# Patient Record
Sex: Female | Born: 1935 | Race: White | Hispanic: No | Smoking: Former smoker
Health system: Southern US, Community
[De-identification: ages and names within clinical notes are randomized; demographics above are authoritative.]

## PROBLEM LIST (undated history)

## (undated) DIAGNOSIS — I10 Essential (primary) hypertension: Secondary | ICD-10-CM

## (undated) DIAGNOSIS — E785 Hyperlipidemia, unspecified: Secondary | ICD-10-CM

## (undated) DIAGNOSIS — F32A Depression, unspecified: Secondary | ICD-10-CM

## (undated) DIAGNOSIS — R112 Nausea with vomiting, unspecified: Secondary | ICD-10-CM

## (undated) DIAGNOSIS — E119 Type 2 diabetes mellitus without complications: Secondary | ICD-10-CM

## (undated) DIAGNOSIS — I517 Cardiomegaly: Secondary | ICD-10-CM

## (undated) DIAGNOSIS — F329 Major depressive disorder, single episode, unspecified: Secondary | ICD-10-CM

## (undated) DIAGNOSIS — I4891 Unspecified atrial fibrillation: Secondary | ICD-10-CM

## (undated) DIAGNOSIS — I509 Heart failure, unspecified: Secondary | ICD-10-CM

## (undated) DIAGNOSIS — Z9889 Other specified postprocedural states: Secondary | ICD-10-CM

## (undated) HISTORY — DX: Hyperlipidemia, unspecified: E78.5

## (undated) HISTORY — DX: Depression, unspecified: F32.A

## (undated) HISTORY — PX: OTHER SURGICAL HISTORY: SHX169

## (undated) HISTORY — DX: Morbid (severe) obesity due to excess calories: E66.01

## (undated) HISTORY — DX: Major depressive disorder, single episode, unspecified: F32.9

## (undated) HISTORY — DX: Cardiomegaly: I51.7

## (undated) HISTORY — DX: Heart failure, unspecified: I50.9

## (undated) HISTORY — DX: Essential (primary) hypertension: I10

---

## 1962-10-15 HISTORY — PX: BREAST SURGERY: SHX581

## 1967-10-16 HISTORY — PX: ABDOMINAL HYSTERECTOMY: SHX81

## 2004-10-15 HISTORY — PX: CATARACT EXTRACTION: SUR2

## 2006-10-15 LAB — HM DIABETES EYE EXAM

## 2007-12-25 ENCOUNTER — Encounter: Payer: Self-pay | Admitting: Internal Medicine

## 2008-06-10 ENCOUNTER — Ambulatory Visit: Payer: Self-pay | Admitting: Internal Medicine

## 2008-06-10 DIAGNOSIS — R0989 Other specified symptoms and signs involving the circulatory and respiratory systems: Secondary | ICD-10-CM

## 2008-06-10 DIAGNOSIS — R0609 Other forms of dyspnea: Secondary | ICD-10-CM

## 2008-06-14 LAB — CONVERTED CEMR LAB
ALT: 13 units/L (ref 0–35)
AST: 16 units/L (ref 0–37)
Albumin: 3.8 g/dL (ref 3.5–5.2)
Alkaline Phosphatase: 73 units/L (ref 39–117)
BUN: 10 mg/dL (ref 6–23)
Basophils Absolute: 0.1 10*3/uL (ref 0.0–0.1)
Basophils Relative: 1 % (ref 0.0–3.0)
Bilirubin, Direct: 0.2 mg/dL (ref 0.0–0.3)
CO2: 32 meq/L (ref 19–32)
Calcium: 8.9 mg/dL (ref 8.4–10.5)
Chloride: 105 meq/L (ref 96–112)
Creatinine, Ser: 1.1 mg/dL (ref 0.4–1.2)
Eosinophils Absolute: 0.3 10*3/uL (ref 0.0–0.7)
Eosinophils Relative: 2.5 % (ref 0.0–5.0)
GFR calc Af Amer: 63 mL/min
GFR calc non Af Amer: 52 mL/min
Glucose, Bld: 70 mg/dL (ref 70–99)
HCT: 40.2 % (ref 36.0–46.0)
Hemoglobin: 14.2 g/dL (ref 12.0–15.0)
Lymphocytes Relative: 17.7 % (ref 12.0–46.0)
MCHC: 35.3 g/dL (ref 30.0–36.0)
MCV: 87.7 fL (ref 78.0–100.0)
Monocytes Absolute: 0.8 10*3/uL (ref 0.1–1.0)
Monocytes Relative: 6.6 % (ref 3.0–12.0)
Neutro Abs: 8.8 10*3/uL — ABNORMAL HIGH (ref 1.4–7.7)
Neutrophils Relative %: 72.2 % (ref 43.0–77.0)
Platelets: 274 10*3/uL (ref 150–400)
Potassium: 4.4 meq/L (ref 3.5–5.1)
Pro B Natriuretic peptide (BNP): 95 pg/mL (ref 0.0–100.0)
RBC: 4.58 M/uL (ref 3.87–5.11)
RDW: 12.5 % (ref 11.5–14.6)
Sodium: 141 meq/L (ref 135–145)
TSH: 1.85 microintl units/mL (ref 0.35–5.50)
Total Bilirubin: 1.1 mg/dL (ref 0.3–1.2)
Total Protein: 6.8 g/dL (ref 6.0–8.3)
WBC: 12.1 10*3/uL — ABNORMAL HIGH (ref 4.5–10.5)

## 2008-07-14 ENCOUNTER — Ambulatory Visit: Payer: Self-pay | Admitting: Internal Medicine

## 2008-07-16 ENCOUNTER — Ambulatory Visit: Payer: Self-pay | Admitting: Cardiovascular Disease

## 2008-07-29 ENCOUNTER — Encounter: Payer: Self-pay | Admitting: Internal Medicine

## 2008-08-25 ENCOUNTER — Encounter: Payer: Self-pay | Admitting: Internal Medicine

## 2008-09-06 ENCOUNTER — Telehealth (INDEPENDENT_AMBULATORY_CARE_PROVIDER_SITE_OTHER): Payer: Self-pay | Admitting: *Deleted

## 2008-09-07 ENCOUNTER — Telehealth (INDEPENDENT_AMBULATORY_CARE_PROVIDER_SITE_OTHER): Payer: Self-pay | Admitting: *Deleted

## 2008-09-11 ENCOUNTER — Encounter: Payer: Self-pay | Admitting: Internal Medicine

## 2008-10-15 LAB — HM MAMMOGRAPHY

## 2008-10-19 ENCOUNTER — Ambulatory Visit: Payer: Self-pay | Admitting: Internal Medicine

## 2008-10-19 DIAGNOSIS — R635 Abnormal weight gain: Secondary | ICD-10-CM | POA: Insufficient documentation

## 2008-10-19 DIAGNOSIS — I2789 Other specified pulmonary heart diseases: Secondary | ICD-10-CM

## 2008-10-26 ENCOUNTER — Encounter: Payer: Self-pay | Admitting: Internal Medicine

## 2009-01-03 ENCOUNTER — Telehealth (INDEPENDENT_AMBULATORY_CARE_PROVIDER_SITE_OTHER): Payer: Self-pay | Admitting: *Deleted

## 2009-09-20 ENCOUNTER — Telehealth (INDEPENDENT_AMBULATORY_CARE_PROVIDER_SITE_OTHER): Payer: Self-pay | Admitting: *Deleted

## 2009-12-20 ENCOUNTER — Ambulatory Visit: Payer: Self-pay | Admitting: Cardiovascular Disease

## 2009-12-26 ENCOUNTER — Telehealth: Payer: Self-pay | Admitting: Cardiovascular Disease

## 2010-08-25 ENCOUNTER — Encounter: Payer: Self-pay | Admitting: Internal Medicine

## 2010-09-06 ENCOUNTER — Telehealth: Payer: Self-pay | Admitting: Internal Medicine

## 2010-09-19 ENCOUNTER — Encounter: Payer: Self-pay | Admitting: Internal Medicine

## 2010-10-18 ENCOUNTER — Encounter: Payer: Self-pay | Admitting: Internal Medicine

## 2010-10-20 ENCOUNTER — Ambulatory Visit
Admission: RE | Admit: 2010-10-20 | Payer: Self-pay | Source: Home / Self Care | Attending: Internal Medicine | Admitting: Internal Medicine

## 2010-10-30 LAB — POCT I-STAT 3, VENOUS BLOOD GAS (G3P V)
Acid-Base Excess: 2 mmol/L (ref 0.0–2.0)
Acid-Base Excess: 3 mmol/L — ABNORMAL HIGH (ref 0.0–2.0)
Acid-Base Excess: 3 mmol/L — ABNORMAL HIGH (ref 0.0–2.0)
Bicarbonate: 26.9 mEq/L — ABNORMAL HIGH (ref 20.0–24.0)
Bicarbonate: 27.5 mEq/L — ABNORMAL HIGH (ref 20.0–24.0)
Bicarbonate: 27.9 mEq/L — ABNORMAL HIGH (ref 20.0–24.0)
O2 Saturation: 57 %
O2 Saturation: 58 %
O2 Saturation: 60 %
TCO2: 28 mmol/L (ref 0–100)
TCO2: 29 mmol/L (ref 0–100)
TCO2: 29 mmol/L (ref 0–100)
pCO2, Ven: 43.5 mmHg — ABNORMAL LOW (ref 45.0–50.0)
pCO2, Ven: 44.3 mmHg — ABNORMAL LOW (ref 45.0–50.0)
pCO2, Ven: 44.4 mmHg — ABNORMAL LOW (ref 45.0–50.0)
pH, Ven: 7.391 — ABNORMAL HIGH (ref 7.250–7.300)
pH, Ven: 7.406 — ABNORMAL HIGH (ref 7.250–7.300)
pH, Ven: 7.41 — ABNORMAL HIGH (ref 7.250–7.300)
pO2, Ven: 30 mmHg (ref 30.0–45.0)
pO2, Ven: 30 mmHg (ref 30.0–45.0)
pO2, Ven: 31 mmHg (ref 30.0–45.0)

## 2010-10-30 LAB — POCT I-STAT 3, ART BLOOD GAS (G3+)
Acid-base deficit: 2 mmol/L (ref 0.0–2.0)
Bicarbonate: 23.4 mEq/L (ref 20.0–24.0)
O2 Saturation: 85 %
TCO2: 25 mmol/L (ref 0–100)
pCO2 arterial: 42.5 mmHg (ref 35.0–45.0)
pH, Arterial: 7.348 — ABNORMAL LOW (ref 7.350–7.400)
pO2, Arterial: 53 mmHg — ABNORMAL LOW (ref 80.0–100.0)

## 2010-10-30 LAB — GLUCOSE, POCT (MANUAL RESULT ENTRY)
Glucose, Bld: 122 mg/dL — ABNORMAL HIGH (ref 70–99)
Operator id: 194801

## 2010-11-14 NOTE — Progress Notes (Signed)
Summary: sch cath  Phone Note Outgoing Call   Call placed by: Meredith Staggers, RN,  September 06, 2010 5:21 PM Summary of Call: received paperwork from Dr Blenda Nicely requesting pt have a right heart cath w/Dr Benismhon fro pulm htn, have called and left message for pt to call back to sch, available days include thur 12/8, Fri 12/9 at 1:30, or Fri 12/16 will wait to hear back from pt  Follow-up for Phone Call        Left message to call back Meredith Staggers, RN  September 19, 2010 11:54 AM   spoke w/pt she only can have done on a Fri offered appt this Fri 12/9 she states she has an appt w/her daughter, offered 12/23 but she states too close to Christmas, cath sch for 1/6 at 1:30 instructions reviewed w/pt over phone and copy mailed to her Meredith Staggers, RN  September 19, 2010 2:19 PM

## 2010-11-14 NOTE — Progress Notes (Signed)
  Phone Note Outgoing Call   Call placed by: Dessie Coma LPN Call placed to: Patient Summary of Call: Patient notified per Dr. Freida Busman, Echo showed normal heart function and labs were normal.  Per patient, her Zenaida Niece has broken down and doesn't know when they will get it fixed so she cannot pick up monitor in [place] on Friday as planned.  She is going to call [place] office to let them know.

## 2010-11-14 NOTE — Letter (Signed)
Summary: Cardiac Catheterization Instructions- [facility]  [facility], Main Office  [address]   [place], [place] [address]   Phone: [phone]  Fax: [phone]     09/19/2010 MRN: [id number]  [facility] [address] [place], [place]  [address]  Dear Ms. SHERRARD,   You are scheduled for a Cardiac Catheterization on Friday Jan. 6, 2012 with Dr. Gala Romney.  Please arrive to the 1st floor of the Heart and Vascular Center at [facility] at 12:30 pm on the day of your procedure. Please do not arrive before 6:30 a.m. Call the Heart and Vascular Center at [phone] if you are unable to make your appointmnet. The Code to get into the parking garage under the building is 0030. Take the elevators to the 1st floor. You must have someone to drive you home. Someone must be with you for the first 24 hours after you arrive home. Please wear clothes that are easy to get on and off and wear slip-on shoes. Do not eat or drink after midnight except water with your medications that morning. Bring all your medications and current insurance cards with you.  _X__ DO NOT take these medications before your procedure: _______Furosemide_________________________________________________________  ___ Make sure you take your aspirin.  ___ You may take ALL of your medications with water that morning. ________________________________________________________________________________________________________________________________  ___ DO NOT take ANY medications before your procedure.  ___ Pre-med instructions:  ________________________________________________________________________________________________________________________________  The usual length of stay after your procedure is 2 to 3 hours. This can vary.  If you have any questions, please call the office at the number listed above.   Meredith Staggers, RN

## 2010-11-16 NOTE — Cardiovascular Report (Signed)
Summary: Pre Cath Orders   Pre Cath Orders   Imported By: Roderic Ovens 09/29/2010 14:32:06  _____________________________________________________________________  External Attachment:    Type:   Image     Comment:   External Document

## 2010-11-16 NOTE — Letter (Signed)
Summary: [facility] Office Visit Note   [facility] Office Visit Note   Imported By: Roderic Ovens 09/26/2010 15:03:23  _____________________________________________________________________  External Attachment:    Type:   Image     Comment:   External Document

## 2011-02-27 NOTE — Letter (Signed)
December 20, 2009    Gwendlyn Deutscher, M.D.  [address]  [place], [place] [address]   RE:  Sarah, CARTELLI  MRN:  [id number]  /  DOB:  15-Nov-1932   Dear Dr. Jeanie Sewer:   I had the pleasure of seeing your patient, [name], in clinic  this morning.  As you know, she is a 77 year old female with chronic  atrial fibrillation who presents with dyspnea on exertion.  It appears  she has symptoms consistent with NYHA class II heart failure.  She does  not have a history of coronary disease.  I am proceeding with a  transthoracic echocardiogram to evaluate her left ventricular systolic  function.  I am also placing her on a 24-hour cardiac event monitor to  gauge her heart rate control, and will check a digoxin level, as this  medication may be contributing to some of the fatigue she is  experiencing.   Thank you for the referral of this patient, and I look forward to  following her along with you.  Please contact my office if I can be of  any further assistance.    Sincerely,      Brayton El, MD  Electronically Signed    SGA/MedQ  DD: 12/20/2009  DT: 12/20/2009  Job #: [id number]

## 2011-02-27 NOTE — Assessment & Plan Note (Signed)
[facility]                        [facility] OFF[facility] NOTE   JURNEI, LATINI                      MRN:          [id number]  DATE:12/20/2009                            DOB:          Feb 02, 1933    CHIEF COMPLAINT:  Shortness of breath.   HISTORY OF PRESENT ILLNESS:  Ms. Brazie is a 77 year old white female  with past medical history significant for chronic atrial fibrillation,  congestive heart failure, diabetes, hypertension, who is presenting for  cardiovascular evaluation.  The patient states that she has had atrial  fibrillation for quite a long time.  She was on Coumadin, but requested  discontinuation of this medication secondary to side effects.  She did  not have an overt GI bleed.  The patient also states she has had 2 left  heart catheterizations last of which was 2 years ago that was completely  within normal limits.  She is unsure as to any conclusions made about  her heart failure in the past.  She does endorse chronic dyspnea on  exertion.  She states that she gets short of breath doing even simple  daily activities.  This has been chronic for over a year.  She believes  that this may be related to anxiety as she does have a long time history  of anxiety and depression.  This is confirmed by her daughter who is  with her today in clinic.  She has had no recent changes to her  medications and is usually compliant with the medications.  However, she  does have some financial constraints and is sometimes unable to fill her  prescriptions.  She denies any chest discomfort, syncopal episodes, or  lower extremity edema.   PAST MEDICAL HISTORY:  As above in HPI.   SOCIAL HISTORY:  She quit smoking 30 years ago, does not drink alcohol.   FAMILY HISTORY:  Negative for premature coronary artery disease.   ALLERGIES:  No known drug allergies.   MEDICATIONS:  1. Aspirin 81 mg daily.  2. Digoxin 0.125 mg 1-1/2 tablets daily.  3. Atenolol 25 mg daily.  4. Furosemide 80 mg daily.  5. Diovan 320 mg daily.  6. Cymbalta 60 mg daily.  7. Potassium supplementation daily.  8. Bactrim DS.  9. Alprazolam.  10.Humulin insulin.   REVIEW OF SYSTEMS:  As in HPI addition, the patient endorses symptoms of  anxiety and depression.  She also states she was able to lose about 70  pounds over the past 2 years.  Other systems as in HPI are otherwise  negative.   PHYSICAL EXAMINATION:  VITAL SIGNS:  Blood pressure is 156/78, pulse is  64, sating 96% on room air.  She weighs 223 pounds.  GENERAL:  No acute distress.  HEENT:  Normocephalic, atraumatic.  NECK:  Supple.  There is no JVD in the seated position.  HEART:  Irregularly irregular distant heart sounds.  LUNGS:  Mild crackles at the left base.  ABDOMEN:  Soft, nontender.  EXTREMITIES:  Without edema.  SKIN:  Warm and dry.  PSYCHIATRIC:  The patient is appropriate.  NEURO:  Appears nonfocal.  MUSCULOSKELETAL:  5/5 bilateral upper and lower extremity strength.   EKG taken today in clinic independently reviewed by myself demonstrates  atrial fibrillation with a ventricular response rate of 59 beats per  minute.  Review of labs dated October 2010, CMP within normal limits  including a potassium 4.7 and a creatinine of 1, CO2 was mildly elevated  at 31.  CBC had a white count of 13.3, hemoglobin 14, platelet count  311.  Records of the patient's heart catheterization, prior echoes,  prior cardiac monitors, and cardiac office visits are not currently  available.   ASSESSMENT:  A 77 year old female that has been experiencing symptoms  consistent with NYHA class II congestive heart failure.  We currently do  not know the patient's systolic function, but she does not have a  history of coronary artery disease nor she having any symptoms  consistent with angina.   PLAN:  We will obtain records including prior echocardiogram, stress  tests, and cardiology office visits.   We will check a digoxin level as  well as place the patient on a 24-hour cardiac event monitor to assess  her heart rate with the atrial fibrillation.  We will order a  transthoracic echocardiogram to check her left ventricular systolic  function as she states she has not had an echo in probably about 2  years' time.  At last, we will check a BNP.  We will contact the patient  once the results of these studies are obtained.     Brayton El, MD  Electronically Signed    SGA/MedQ  DD: 12/20/2009  DT: 12/21/2009  Job #: [id number]

## 2011-05-14 ENCOUNTER — Encounter: Payer: Self-pay | Admitting: Cardiovascular Disease

## 2011-06-22 ENCOUNTER — Encounter: Payer: Self-pay | Admitting: Cardiovascular Disease

## 2011-07-12 ENCOUNTER — Telehealth: Payer: Self-pay | Admitting: Internal Medicine

## 2011-07-12 NOTE — Telephone Encounter (Signed)
All Cardiac faxed to [facility] @ [phone]  07/12/11/km

## 2011-10-16 LAB — HM PAP SMEAR

## 2012-07-31 ENCOUNTER — Other Ambulatory Visit: Payer: Self-pay | Admitting: Obstetrics & Gynecology

## 2012-07-31 DIAGNOSIS — R198 Other specified symptoms and signs involving the digestive system and abdomen: Secondary | ICD-10-CM

## 2012-08-05 ENCOUNTER — Ambulatory Visit (HOSPITAL_COMMUNITY)
Admission: RE | Admit: 2012-08-05 | Discharge: 2012-08-05 | Disposition: A | Discharge status: Home / Self Care | Payer: Medicare Other | Source: Ambulatory Visit | Attending: Obstetrics & Gynecology | Admitting: Obstetrics & Gynecology

## 2012-08-05 DIAGNOSIS — R198 Other specified symptoms and signs involving the digestive system and abdomen: Secondary | ICD-10-CM

## 2012-08-05 DIAGNOSIS — R635 Abnormal weight gain: Secondary | ICD-10-CM | POA: Insufficient documentation

## 2012-08-05 DIAGNOSIS — J984 Other disorders of lung: Secondary | ICD-10-CM | POA: Insufficient documentation

## 2012-08-05 DIAGNOSIS — K449 Diaphragmatic hernia without obstruction or gangrene: Secondary | ICD-10-CM | POA: Insufficient documentation

## 2012-08-05 DIAGNOSIS — I517 Cardiomegaly: Secondary | ICD-10-CM | POA: Insufficient documentation

## 2012-08-05 MED ORDER — IOHEXOL 300 MG/ML  SOLN
100.0000 mL | Freq: Once | INTRAMUSCULAR | Status: AC | PRN
  Administered 2012-08-05: 100 mL via INTRAVENOUS

## 2012-12-10 ENCOUNTER — Ambulatory Visit (INDEPENDENT_AMBULATORY_CARE_PROVIDER_SITE_OTHER)
Admission: RE | Admit: 2012-12-10 | Discharge: 2012-12-10 | Disposition: A | Discharge status: Home / Self Care | Payer: Medicare Other | Source: Ambulatory Visit | Attending: Internal Medicine | Admitting: Internal Medicine

## 2012-12-10 ENCOUNTER — Encounter: Payer: Self-pay | Admitting: Internal Medicine

## 2012-12-10 ENCOUNTER — Ambulatory Visit (INDEPENDENT_AMBULATORY_CARE_PROVIDER_SITE_OTHER): Payer: Medicare Other | Admitting: Internal Medicine

## 2012-12-10 VITALS — BP 180/100 | HR 88 | Temp 97.2°F | Ht 67.0 in | Wt 249.8 lb

## 2012-12-10 DIAGNOSIS — R0609 Other forms of dyspnea: Secondary | ICD-10-CM

## 2012-12-10 DIAGNOSIS — I10 Essential (primary) hypertension: Secondary | ICD-10-CM

## 2012-12-10 DIAGNOSIS — J9610 Chronic respiratory failure, unspecified whether with hypoxia or hypercapnia: Secondary | ICD-10-CM | POA: Insufficient documentation

## 2012-12-10 DIAGNOSIS — R0600 Dyspnea, unspecified: Secondary | ICD-10-CM

## 2012-12-10 MED ORDER — NEBIVOLOL HCL 10 MG PO TABS: 10.0000 mg | ORAL_TABLET | Freq: Every day | ORAL | Status: DC

## 2012-12-10 NOTE — Patient Instructions (Addendum)
bystolic 10 mg twice daily in place of atenolol  Only use nebulizer if you feel it really helps you  Wear 02 2lpm 24 hours per day for  Now  Please remember to go to the   x-ray department downstairs for your tests - we will call you with the results when they are available.  Please schedule a follow up office visit in 4 weeks, sooner if needed with pfts' on return

## 2012-12-10 NOTE — Progress Notes (Addendum)
Subjective:    Patient ID: Sarah Kirk, female    DOB: [birth year] MRN: [id number]  HPI  59 yowf quit smoking 1981 at wt < 170-200  eval in 2010 in pulmonary clinic with mostly restrictive changes c/w obesity.   Previous w/u: baseline = last Nov 08 dx with chf transiently better and fine able to walk outside up incline until Feb 09 gradual worsening to point of sob rm to rm no noct awakening just sob if she has to get out of bed to go to the bathroom. neb doesn't help much.  06/10/08 initial eval, copd vs ace, ace d/c'd   July 14, 2008 ov 100% better to her satisfaction after stopped ace, returns for PFT's, no limiiting sob, cough, cp. only finding was disproportionate reduction in diffusing capacity.   October 19, 2008 ov  Because of obesity I worried about occult thromboembolic disease and obtained a CT scan of her chest which was normal except for elevated right heart structures suggesting chronic pulmonary hypertension. This lead to overnight sleep oximetry showing 25 minutes of desaturation for which I recommended she be placed on oxygen at 2 L but she says she feels better when she sleeps without it.   rec noct 02 titrated to adequate sats Serial echo to follow ? PAH > to see [facility] .     12/10/2012 1st pulmonary ov in EPIC era with wt drop from 272  Down to 190  2013  With no problem  Breathing and no need for 02 then started downhill since Feb 2013 didn't directly relate to wt much worse since Aug 2013 when fell broke 2 ribs >  now sob room to room, placed on 02 prn since a year ago. No better on neb.  No obvious daytime variabilty or assoc chronic cough or cp or chest tightness, subjective wheeze overt sinus or hb symptoms. No unusual exp hx or h/o childhood pna/ asthma or premature birth to her knowledge.   Sleeping ok without nocturnal  or early am exacerbation  of respiratory  c/o's or need for noct saba. 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 Negative for fever and chills.  HENT: Positive for sneezing and dental problem. Negative for ear pain, nosebleeds, congestion, sore throat, rhinorrhea, trouble swallowing, voice change, postnasal drip and sinus pressure.   Eyes: Negative for visual disturbance.  Respiratory: Positive for cough and shortness of breath. Negative for choking.   Cardiovascular: Positive for leg swelling. Negative for chest pain.  Gastrointestinal: Negative for vomiting, abdominal pain and diarrhea.  Genitourinary: Negative for difficulty urinating.  Musculoskeletal: Positive for arthralgias.  Skin: Negative for rash.  Neurological: Positive for headaches. Negative for tremors and syncope.  Hematological: Does not bruise/bleed easily.  Objective:   Physical Exam amb obese wf nad Wt Readings from Last 3 Encounters:  12/10/12 249 lb 12.8 oz (113.309 kg)  10/19/08 234 lb (106.142 kg)  07/14/08 238 lb (107.956 kg)    amb obese wm nad with prominent pseudoasthma  HEENT: nl dentition, turbinates, and orophanx. Nl external ear canals without cough reflex   NECK :  without JVD/Nodes/TM/ nl carotid upstrokes bilaterally   LUNGS: no acc muscle use, clear to A and P bilaterally without cough on insp or exp maneuvers   CV:  RRR   no s3 or murmur or increase in P2, no edema   ABD:  soft and nontender with limited excursion with inspiration. No bruits or organomegaly, bowel sounds nl  MS:  warm without deformities, calf tenderness, cyanosis or clubbing  SKIN: warm and dry without lesions    NEURO:  alert, approp, no deficits    CXR  12/11/2012 :  Diffuse chronic pulmonary parenchymal changes without acute finding.            Assessment & Plan:

## 2012-12-12 NOTE — Progress Notes (Signed)
Quick Note:  Spoke with pt and notified of results per Dr. Wert. Pt verbalized understanding and denied any questions.  ______ 

## 2012-12-14 ENCOUNTER — Encounter: Payer: Self-pay | Admitting: Internal Medicine

## 2012-12-14 NOTE — Assessment & Plan Note (Signed)
Changed to bystolic due to ? Asthma component effective 12/11/12  Not clear whether failure to respond to saba means she doesn't have an asthmatic component or that she does but the B Blocker effect of relatively high doses of atenolol prevents it from working  Strongly prefer in this setting: Bystolic, the most beta -1  selective Beta blocker available in sample form, with bisoprolol the most selective generic choice  on the market.

## 2012-12-14 NOTE — Assessment & Plan Note (Signed)
-   12/10/2012  Walked 2lpm x 1 laps @ 185 ft each stopped due to  desat to 88%  Previous eval entirely c/w obesity but will need re-eval and more aggressive rx if developing complications like PAH

## 2013-01-21 ENCOUNTER — Ambulatory Visit: Payer: Medicare Other | Admitting: Internal Medicine

## 2013-01-28 ENCOUNTER — Ambulatory Visit (INDEPENDENT_AMBULATORY_CARE_PROVIDER_SITE_OTHER): Payer: Medicare Other

## 2013-01-28 ENCOUNTER — Ambulatory Visit (INDEPENDENT_AMBULATORY_CARE_PROVIDER_SITE_OTHER): Payer: Medicare Other | Admitting: Internal Medicine

## 2013-01-28 ENCOUNTER — Encounter: Payer: Self-pay | Admitting: Internal Medicine

## 2013-01-28 VITALS — BP 118/80 | HR 81 | Temp 97.8°F | Ht 68.0 in | Wt 253.0 lb

## 2013-01-28 DIAGNOSIS — N3090 Cystitis, unspecified without hematuria: Secondary | ICD-10-CM

## 2013-01-28 DIAGNOSIS — I10 Essential (primary) hypertension: Secondary | ICD-10-CM

## 2013-01-28 DIAGNOSIS — R0989 Other specified symptoms and signs involving the circulatory and respiratory systems: Secondary | ICD-10-CM

## 2013-01-28 DIAGNOSIS — R0609 Other forms of dyspnea: Secondary | ICD-10-CM

## 2013-01-28 DIAGNOSIS — J9610 Chronic respiratory failure, unspecified whether with hypoxia or hypercapnia: Secondary | ICD-10-CM

## 2013-01-28 LAB — CBC WITH DIFFERENTIAL/PLATELET
Basophils Absolute: 0 10*3/uL (ref 0.0–0.1)
Basophils Relative: 0.4 % (ref 0.0–3.0)
Eosinophils Absolute: 0.3 10*3/uL (ref 0.0–0.7)
Hemoglobin: 13.9 g/dL (ref 12.0–15.0)
Lymphocytes Relative: 13.1 % (ref 12.0–46.0)
MCHC: 34.5 g/dL (ref 30.0–36.0)
Monocytes Relative: 6.7 % (ref 3.0–12.0)
Neutrophils Relative %: 77.4 % — ABNORMAL HIGH (ref 43.0–77.0)
RBC: 4.6 Mil/uL (ref 3.87–5.11)
RDW: 13.6 % (ref 11.5–14.6)

## 2013-01-28 LAB — URINALYSIS, ROUTINE W REFLEX MICROSCOPIC
Bilirubin Urine: NEGATIVE
Total Protein, Urine: 30
Urine Glucose: 500
Urobilinogen, UA: 0.2 (ref 0.0–1.0)

## 2013-01-28 LAB — BRAIN NATRIURETIC PEPTIDE: Pro B Natriuretic peptide (BNP): 204 pg/mL — ABNORMAL HIGH (ref 0.0–100.0)

## 2013-01-28 LAB — SEDIMENTATION RATE: Sed Rate: 25 mm/hr — ABNORMAL HIGH (ref 0–22)

## 2013-01-28 NOTE — Patient Instructions (Addendum)
bystolic 10 mg daily instead of tenormin until you come back  Try nebulizer when your breathing is bad at rest or you know a certain  activity is going to make you short of breath to see if it helps.    Try prilosec 20mg   Take 30-60 min before first meal of the day and Pepcid 20 mg one bedtime   GERD (REFLUX)  is an extremely common cause of respiratory symptoms, many times with no significant heartburn at all.    It can be treated with medication, but also with lifestyle changes including avoidance of late meals, excessive alcohol, smoking cessation, and avoid fatty foods, chocolate, peppermint, colas, red wine, and acidic juices such as orange juice.  NO MINT OR MENTHOL PRODUCTS SO NO COUGH DROPS  USE SUGARLESS CANDY INSTEAD (jolley ranchers or Stover's)  NO OIL BASED VITAMINS - use powdered substitutes.    Please remember to go to the lab x-ray department downstairs for your tests - we will call you with the results when they are available.  We need to be sure you never take macrodantin again (nitrofurantoin)      See Tammy NP w/in 2 weeks with all your medications, even over the counter meds, separated in two separate bags, the ones you take no matter what vs the ones you stop once you feel better and take only as needed when you feel you need them.   Tammy  will generate for you a new user friendly medication calendar that will put [place] all on the same page re: your medication use.     Without this process, it simply isn't possible to assure that we are providing  your outpatient care  with  the attention to detail we feel you deserve.   If we cannot assure that you're getting that kind of care,  then we cannot manage your problem effectively from this clinic.  Once you have seen Tammy and we are sure that we're all on the same page with your medication use she will arrange follow up with me.   Late add 02 3lpm 24/7 for now and check to see what happened to bmet

## 2013-01-28 NOTE — Progress Notes (Signed)
Subjective:    Patient ID: Sarah Kirk, female    DOB: 06-13-1933 MRN: [id number]  HPI  22 yowf quit smoking 1981 at wt < 170-200  eval in 2010 in pulmonary clinic with mostly restrictive changes c/w obesity.   Previous w/u: baseline = last Nov 08 dx with chf transiently better and fine able to walk outside up incline until Feb 09 gradual worsening to point of sob rm to rm no noct awakening just sob if she has to get out of bed to go to the bathroom. neb doesn't help much.  06/10/08 initial eval, copd vs ace, ace d/c'd   July 14, 2008 ov 100% better to her satisfaction after stopped ace, returns for PFT's, no limiiting sob, cough, cp. only finding was disproportionate reduction in diffusing capacity.   October 19, 2008 ov  Because of obesity I worried about occult thromboembolic disease and obtained a CT scan of her chest which was normal except for elevated right heart structures suggesting chronic pulmonary hypertension. This lead to overnight sleep oximetry showing 25 minutes of desaturation for which I recommended she be placed on oxygen at 2 L but she says she feels better when she sleeps without it.   rec noct 02 titrated to adequate sats Serial echo to follow ? PAH > to see [facility] .     12/10/2012 1st pulmonary ov in EPIC era with wt drop from 272  Down to 190  Early in 2013  With no problem  Breathing and no need for 02 then started downhill since Feb 2013 with steady wt gain  Since then fell June 2013    fell broke 2 ribs >  now sob room to room, placed on 02 prn since a year ago. No better on neb. rec bystolic 10 mg twice daily in place of atenolol Only use nebulizer if you feel it really helps you Wear 02 2lpm 24 hours per day for  Now Please remember to go to the   x-ray department downstairs for your tests - we will call you with the results when they are available.   .01/28/2013 f/u ov/Wert f/u sob ? Etiology Chief Complaint  Patient presents with  . Acute  Visit    Breathing worse for the past 2 wks, gets out of breath with any exertion at all, such as standing to weigh on the scale today.   sleeps on side on 02 ok horizontal and no increase in chronic leg swelling or cp of any kind. Sob is moderate and comes on just with exertion, not at rest, not clear using 02 as directed - not clear whether may have received macrodantin recently for uti (daughter thinks so but unclear on specifics)   No obvious daytime variabilty or assoc chronic cough or cp or chest tightness, subjective wheeze overt sinus or hb symptoms. No unusual exp hx or h/o childhood pna/ asthma or premature birth to her knowledge.   Sleeping ok without nocturnal  or early am exacerbation  of respiratory  c/o's or need for noct saba. Also denies any obvious fluctuation of symptoms with weather or environmental changes or other aggravating or alleviating factors except as outlined above   ROS  The following are not active complaints unless bolded sore throat, dysphagia, dental problems, itching, sneezing,  nasal congestion or excess/ purulent secretions, ear ache,   fever, chills, sweats, unintended wt loss, pleuritic or exertional cp, hemoptysis,  orthopnea pnd or leg swelling, presyncope, palpitations, heartburn, abdominal pain, anorexia, nausea,  vomiting, diarrhea  or change in bowel or urinary habits, change in stools or urine, dysuria,hematuria,  rash, arthralgias, visual complaints, headache, numbness weakness or ataxia or problems with walking or coordination,  change in mood/affect or memory.        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 Nl external ear canals without cough reflex   NECK :  without JVD/Nodes/TM/ nl carotid upstrokes bilaterally   LUNGS: no acc muscle use, clear to A and P bilaterally without cough on insp or exp maneuvers   CV:  RRR  no s3 or murmur or increase in P2,  2plus bil lower ext edema   ABD:  soft and nontender with limited excursion with inspiration. No bruits or organomegaly, bowel sounds nl  MS:  warm without deformities, calf tenderness, cyanosis or clubbing  SKIN: warm and dry without lesions    NEURO:  alert, approp, no deficits    CXR  12/11/2012 :  Diffuse chronic pulmonary parenchymal changes without acute finding.   01/28/13 labs ok including bnp 201 and ESR 25 and nl cbc bmet collected but ? Not done           Assessment & Plan:

## 2013-01-30 LAB — URINE CULTURE

## 2013-01-30 NOTE — Assessment & Plan Note (Signed)
Not clear whether or not there is an asthmatic component to her symptoms but for now Strongly prefer in this setting: Bystolic, the most beta -1  selective Beta blocker available in sample form, with bisoprolol the most selective generic choice  on the market.  Therefore given samples of bystolic 10 mg daily

## 2013-01-30 NOTE — Assessment & Plan Note (Signed)
U/a positive for wbc's but no specific organism. In absence of more convincing evidence for infection will hold abx and avoid macrodantin indefinitely.

## 2013-01-30 NOTE — Assessment & Plan Note (Addendum)
Unclear what the mechanism is but suspect combination of obesity with basilar atx and ? ILD related to macrodantin but note esr only 25  See instructions for specific recommendations which were reviewed directly with the patient who was given a copy with highlighter outlining the key components.   For now should continue 02 3lpm 24/7    Each maintenance medication was reviewed in detail including most importantly the difference between maintenance and as needed and under what circumstances the prns are to be used.  Please see instructions for details which were reviewed in writing and the patient given a copy.  Struggling with concept of med reconciliation.  To keep things simple, I have asked the patient to first separate medicines that are perceived as maintenance, that is to be taken daily "no matter what", from those medicines that are taken on only on an as-needed basis and I have given the patient examples of both, and then return to see our NP to generate a  detailed  medication calendar which should be followed until the next physician sees the patient and updates it.

## 2013-02-02 ENCOUNTER — Telehealth: Payer: Self-pay | Admitting: Internal Medicine

## 2013-02-02 NOTE — Telephone Encounter (Signed)
Spoke with Daughter and notified of labs She states that the lab said they lost a tube of her blood so she will have to come back tomorrow to give blood again I advised will call her back once we get the final labs Also informed her to have pt use o2 24/7 She verbalized understanidng

## 2013-02-02 NOTE — Telephone Encounter (Signed)
Message copied by Christen Butter on Mon Feb 02, 2013 10:09 AM ------      Message from: Sandrea Hughs B      Created: Fri Jan 30, 2013  7:39 PM             Be sure she continues 02 3lpm 24/7 for now and check to see what happened to bmet ------

## 2013-02-02 NOTE — Telephone Encounter (Signed)
LMTCB

## 2013-02-03 ENCOUNTER — Telehealth: Payer: Self-pay | Admitting: Internal Medicine

## 2013-02-03 ENCOUNTER — Ambulatory Visit: Payer: Medicare Other

## 2013-02-03 DIAGNOSIS — R0609 Other forms of dyspnea: Secondary | ICD-10-CM

## 2013-02-03 LAB — TSH: TSH: 2.7 u[IU]/mL (ref 0.35–5.50)

## 2013-02-03 NOTE — Progress Notes (Signed)
Quick Note:  Pt aware ______ 

## 2013-02-03 NOTE — Telephone Encounter (Signed)
Labs faxed to both docs Spoke with pt and notified that this was done She verbalized understanding and states nothing further needed

## 2013-02-04 ENCOUNTER — Encounter: Payer: Self-pay | Admitting: Family Medicine

## 2013-02-05 ENCOUNTER — Telehealth: Payer: Self-pay | Admitting: *Deleted

## 2013-02-05 NOTE — Telephone Encounter (Signed)
Called, spoke with pt's daughter to inform her of TSH results per [name]:  Notes Recorded by Nyoka Cowden, MD on 02/04/2013 at 8:16 AM Call patient : Study is unremarkable, no change in recs  ------  Informed her of above TSH results.  She verbalized understanding.  States pt was taken to [facility] yesterday and admitted with CHF and and "extremely bad" UTI.  She would like MW to be aware of this.   Also, she states she has only been advised of the urine results and would like to know if specifically came back in other lab work done that they need to be worried about.  [name], will you please look at pt's labs done last week and advise.  Thank you.

## 2013-02-05 NOTE — Telephone Encounter (Signed)
[email or web address]  

## 2013-02-05 NOTE — Telephone Encounter (Signed)
All other labs were fine including what we use to diagnose heart failure ( less than 500 is ok and hers was much lower)

## 2013-02-05 NOTE — Progress Notes (Signed)
Quick Note:  Called, spoke with pt's daughter. Informed her of TSH results and recs per [name]. She verbalized understanding. ______

## 2013-02-09 NOTE — Telephone Encounter (Signed)
Daughter aware of results. Per daughter pt just released from [facility] and will see TP on Thursday. Will forward to JJ so she can call for recs since she will see TP.

## 2013-02-11 NOTE — Telephone Encounter (Signed)
[facility] will not release records w/o signed release from patient Will have pt to sign release at Med Cal appt w/ TP tomorrow 5.1.14 Will sign off

## 2013-02-12 ENCOUNTER — Encounter: Payer: Medicare Other | Admitting: Adult Health

## 2013-02-12 ENCOUNTER — Encounter: Payer: Self-pay | Admitting: Adult Health

## 2013-02-12 ENCOUNTER — Ambulatory Visit (INDEPENDENT_AMBULATORY_CARE_PROVIDER_SITE_OTHER): Payer: Medicare Other | Admitting: Adult Health

## 2013-02-12 VITALS — BP 116/80 | HR 73 | Temp 97.2°F | Ht 68.0 in | Wt 240.8 lb

## 2013-02-12 DIAGNOSIS — R9389 Abnormal findings on diagnostic imaging of other specified body structures: Secondary | ICD-10-CM

## 2013-02-12 DIAGNOSIS — J9610 Chronic respiratory failure, unspecified whether with hypoxia or hypercapnia: Secondary | ICD-10-CM

## 2013-02-12 NOTE — Patient Instructions (Addendum)
Follow med calendar closely and bring to each visit.  Return in 2 weeks for PFT and office visit with [name]   We are referring you to PCP within [place] .  Please contact office for sooner follow up if symptoms do not improve or worsen or seek emergency care

## 2013-02-16 NOTE — Progress Notes (Signed)
Subjective:    Patient ID: Sarah Kirk, female    DOB: Mar 10, 1933 MRN: [id number]  HPI  24 yowf quit smoking 1981 at wt < 170-200  eval in 2010 in pulmonary clinic with mostly restrictive changes c/w obesity.   Previous w/u: baseline = last Nov 08 dx with chf transiently better and fine able to walk outside up incline until Feb 09 gradual worsening to point of sob rm to rm no noct awakening just sob if she has to get out of bed to go to the bathroom. neb doesn't help much.  06/10/08 initial eval, copd vs ace, ace d/c'd   July 14, 2008 ov 100% better to her satisfaction after stopped ace, returns for PFT's, no limiiting sob, cough, cp. only finding was disproportionate reduction in diffusing capacity.   October 19, 2008 ov  Because of obesity I worried about occult thromboembolic disease and obtained a CT scan of her chest which was normal except for elevated right heart structures suggesting chronic pulmonary hypertension. This lead to overnight sleep oximetry showing 25 minutes of desaturation for which I recommended she be placed on oxygen at 2 L but she says she feels better when she sleeps without it.   rec noct 02 titrated to adequate sats Serial echo to follow ? PAH > to see [facility]ology .     12/10/2012 1st pulmonary ov in EPIC era with wt drop from 272  Down to 190  Early in 2013  With no problem  Breathing and no need for 02 then started downhill since Feb 2013 with steady wt gain  Since then fell June 2013    fell broke 2 ribs >  now sob room to room, placed on 02 prn since a year ago. No better on neb. rec bystolic 10 mg twice daily in place of atenolol Only use nebulizer if you feel it really helps you Wear 02 2lpm 24 hours per day for  Now Please remember to go to the   x-ray department downstairs for your tests - we will call you with the results when they are available.   .01/28/2013 f/u ov/Wert f/u sob ? Etiology Chief Complaint  Patient presents with  . Acute  Visit    Breathing worse for the past 2 wks, gets out of breath with any exertion at all, such as standing to weigh on the scale today.   sleeps on side on 02 ok horizontal and no increase in chronic leg swelling or cp of any kind. Sob is moderate and comes on just with exertion, not at rest, not clear using 02 as directed - not clear whether may have received macrodantin recently for uti (daughter thinks so but unclear on specifics)     02/12/13 Follow up and med calendar  Returns for follow up and med review  We reviewed all her meds and organized them into a med calendar with pt education .  Finished prednisone yesterday.  Hospital records reviewed from [place].  Recently admitted to [facility] for new onset Atrial fib w/ CHF. COPD flare  Atrial fib was tx w /rate control , no anticoagulation.  CT chest showed mild chronic interstitial lung disease ? NSIP  She was tx w/ IV steroids and diureis. No discharge summary was present.  She is feeling better but still weak.  Denies chest pain, orthopnea, syncope or fever.  Wt is down 13 lbs , edema is less.    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 Nl external ear canals without cough reflex   NECK :  without JVD/Nodes/TM/ nl carotid upstrokes bilaterally   LUNGS: no acc muscle use, clear to A and P  bilaterally without cough on insp or exp maneuvers   CV: irreg  no s3 or murmur or increase in P2,  Tr-1+bil lower ext edema   ABD:  soft and nontender with limited excursion with inspiration. No bruits or organomegaly, bowel sounds nl  MS:  warm without deformities, calf tenderness, cyanosis or clubbing  SKIN: warm and dry without lesions    NEURO:  alert, approp, no deficits   CT chest 02/06/13  Suspected mild chronic interstitial lung disease, possibly>nonspecific interstitial pneumonitis (NSIP), progressed from 2011. Small mediastinal lymph nodes, as described above, likely reactive No evidence of acute cardiopulmonary disease.        Assessment & Plan:

## 2013-02-17 NOTE — Assessment & Plan Note (Addendum)
Patient's medications were reviewed today and patient education was given. Computerized medication calendar was adjusted/completed   ABN CT w/ ? NSIP - have her return for PFT   Plan  Follow med calendar closely and bring to each visit.   Return in 2 weeks for PFT and office visit with [name]   We are referring you to PCP within [place] .  Please contact office for sooner follow up if symptoms do not improve or worsen or seek emergency care

## 2013-02-19 ENCOUNTER — Encounter: Payer: Self-pay | Admitting: Adult Health

## 2013-02-19 DIAGNOSIS — R9389 Abnormal findings on diagnostic imaging of other specified body structures: Secondary | ICD-10-CM | POA: Insufficient documentation

## 2013-02-23 ENCOUNTER — Inpatient Hospital Stay (HOSPITAL_COMMUNITY)
Admission: EM | Admit: 2013-02-23 | Discharge: 2013-02-26 | DRG: 689 | Disposition: A | Discharge status: Home Health | Payer: Medicare Other | Attending: Internal Medicine | Admitting: Internal Medicine

## 2013-02-23 ENCOUNTER — Encounter (HOSPITAL_COMMUNITY): Payer: Self-pay | Admitting: Emergency Medicine

## 2013-02-23 ENCOUNTER — Emergency Department (HOSPITAL_COMMUNITY): Payer: Medicare Other

## 2013-02-23 DIAGNOSIS — N390 Urinary tract infection, site not specified: Principal | ICD-10-CM | POA: Diagnosis present

## 2013-02-23 DIAGNOSIS — Z794 Long term (current) use of insulin: Secondary | ICD-10-CM

## 2013-02-23 DIAGNOSIS — F329 Major depressive disorder, single episode, unspecified: Secondary | ICD-10-CM

## 2013-02-23 DIAGNOSIS — R112 Nausea with vomiting, unspecified: Secondary | ICD-10-CM | POA: Diagnosis present

## 2013-02-23 DIAGNOSIS — F3289 Other specified depressive episodes: Secondary | ICD-10-CM | POA: Diagnosis present

## 2013-02-23 DIAGNOSIS — Z9981 Dependence on supplemental oxygen: Secondary | ICD-10-CM

## 2013-02-23 DIAGNOSIS — E876 Hypokalemia: Secondary | ICD-10-CM | POA: Diagnosis present

## 2013-02-23 DIAGNOSIS — N289 Disorder of kidney and ureter, unspecified: Secondary | ICD-10-CM

## 2013-02-23 DIAGNOSIS — I509 Heart failure, unspecified: Secondary | ICD-10-CM | POA: Diagnosis present

## 2013-02-23 DIAGNOSIS — J9610 Chronic respiratory failure, unspecified whether with hypoxia or hypercapnia: Secondary | ICD-10-CM | POA: Diagnosis present

## 2013-02-23 DIAGNOSIS — I517 Cardiomegaly: Secondary | ICD-10-CM | POA: Diagnosis present

## 2013-02-23 DIAGNOSIS — I4891 Unspecified atrial fibrillation: Secondary | ICD-10-CM | POA: Diagnosis present

## 2013-02-23 DIAGNOSIS — E871 Hypo-osmolality and hyponatremia: Secondary | ICD-10-CM | POA: Diagnosis present

## 2013-02-23 DIAGNOSIS — N179 Acute kidney failure, unspecified: Secondary | ICD-10-CM | POA: Diagnosis present

## 2013-02-23 DIAGNOSIS — R109 Unspecified abdominal pain: Secondary | ICD-10-CM | POA: Diagnosis present

## 2013-02-23 DIAGNOSIS — G4730 Sleep apnea, unspecified: Secondary | ICD-10-CM | POA: Diagnosis present

## 2013-02-23 DIAGNOSIS — D72829 Elevated white blood cell count, unspecified: Secondary | ICD-10-CM | POA: Diagnosis present

## 2013-02-23 DIAGNOSIS — E1110 Type 2 diabetes mellitus with ketoacidosis without coma: Secondary | ICD-10-CM | POA: Diagnosis present

## 2013-02-23 DIAGNOSIS — E785 Hyperlipidemia, unspecified: Secondary | ICD-10-CM | POA: Diagnosis present

## 2013-02-23 DIAGNOSIS — E1310 Other specified diabetes mellitus with ketoacidosis without coma: Secondary | ICD-10-CM | POA: Diagnosis present

## 2013-02-23 DIAGNOSIS — I10 Essential (primary) hypertension: Secondary | ICD-10-CM

## 2013-02-23 DIAGNOSIS — E872 Acidosis: Secondary | ICD-10-CM

## 2013-02-23 DIAGNOSIS — I2789 Other specified pulmonary heart diseases: Secondary | ICD-10-CM | POA: Diagnosis present

## 2013-02-23 DIAGNOSIS — E860 Dehydration: Secondary | ICD-10-CM | POA: Diagnosis present

## 2013-02-23 DIAGNOSIS — R1110 Vomiting, unspecified: Secondary | ICD-10-CM

## 2013-02-23 DIAGNOSIS — Z79899 Other long term (current) drug therapy: Secondary | ICD-10-CM

## 2013-02-23 DIAGNOSIS — Z6835 Body mass index (BMI) 35.0-35.9, adult: Secondary | ICD-10-CM

## 2013-02-23 DIAGNOSIS — N8111 Cystocele, midline: Secondary | ICD-10-CM | POA: Diagnosis present

## 2013-02-23 HISTORY — DX: Type 2 diabetes mellitus without complications: E11.9

## 2013-02-23 HISTORY — DX: Unspecified atrial fibrillation: I48.91

## 2013-02-23 HISTORY — DX: Other specified postprocedural states: Z98.890

## 2013-02-23 HISTORY — DX: Other specified postprocedural states: R11.2

## 2013-02-23 LAB — COMPREHENSIVE METABOLIC PANEL
Albumin: 3.6 g/dL (ref 3.5–5.2)
BUN: 54 mg/dL — ABNORMAL HIGH (ref 6–23)
Calcium: 9.2 mg/dL (ref 8.4–10.5)
Chloride: 74 mEq/L — ABNORMAL LOW (ref 96–112)
Creatinine, Ser: 2.17 mg/dL — ABNORMAL HIGH (ref 0.50–1.10)
GFR calc non Af Amer: 21 mL/min — ABNORMAL LOW (ref >90)
Total Bilirubin: 3.2 mg/dL — ABNORMAL HIGH (ref 0.3–1.2)

## 2013-02-23 LAB — CBC WITH DIFFERENTIAL/PLATELET
Basophils Absolute: 0 10*3/uL (ref 0.0–0.1)
Basophils Relative: 0 % (ref 0–1)
Eosinophils Absolute: 0 10*3/uL (ref 0.0–0.7)
HCT: 43.1 % (ref 36.0–46.0)
Hemoglobin: 15.8 g/dL — ABNORMAL HIGH (ref 12.0–15.0)
Lymphocytes Relative: 9 % — ABNORMAL LOW (ref 12–46)
Lymphs Abs: 1.7 10*3/uL (ref 0.7–4.0)
MCH: 29.5 pg (ref 26.0–34.0)
MCHC: 36.7 g/dL — ABNORMAL HIGH (ref 30.0–36.0)
MCV: 80.6 fL (ref 78.0–100.0)
Monocytes Absolute: 1.9 10*3/uL — ABNORMAL HIGH (ref 0.1–1.0)
Neutro Abs: 15.7 10*3/uL — ABNORMAL HIGH (ref 1.7–7.7)
RDW: 13.4 % (ref 11.5–15.5)

## 2013-02-23 LAB — URINALYSIS, ROUTINE W REFLEX MICROSCOPIC
Glucose, UA: >1000 mg/dL — AB
Protein, ur: 30 mg/dL — AB
Specific Gravity, Urine: 1.018 (ref 1.005–1.030)
Urobilinogen, UA: 1 mg/dL (ref 0.0–1.0)

## 2013-02-23 LAB — MAGNESIUM: Magnesium: 2.3 mg/dL (ref 1.5–2.5)

## 2013-02-23 LAB — PRO B NATRIURETIC PEPTIDE: Pro B Natriuretic peptide (BNP): 1085 pg/mL — ABNORMAL HIGH (ref 0–450)

## 2013-02-23 LAB — LIPASE, BLOOD: Lipase: 46 U/L (ref 11–59)

## 2013-02-23 MED ORDER — FLUOXETINE HCL 40 MG PO CAPS: 40.0000 mg | ORAL_CAPSULE | Freq: Every day | ORAL | Status: DC

## 2013-02-23 MED ORDER — FLUOXETINE HCL 20 MG PO CAPS
40.0000 mg | ORAL_CAPSULE | Freq: Every day | ORAL | Status: DC
  Administered 2013-02-23 – 2013-02-26 (×4): 40 mg via ORAL
  Filled 2013-02-23 (×4): qty 2

## 2013-02-23 MED ORDER — DEXTROSE 5 % IV SOLN
2.0000 g | INTRAVENOUS | Status: DC
  Administered 2013-02-23 – 2013-02-25 (×3): 2 g via INTRAVENOUS
  Filled 2013-02-23 (×4): qty 2

## 2013-02-23 MED ORDER — NEBIVOLOL HCL 10 MG PO TABS
10.0000 mg | ORAL_TABLET | Freq: Every day | ORAL | Status: DC
  Administered 2013-02-23 – 2013-02-26 (×4): 10 mg via ORAL
  Filled 2013-02-23 (×4): qty 1

## 2013-02-23 MED ORDER — ONDANSETRON HCL 4 MG/2ML IJ SOLN
4.0000 mg | Freq: Once | INTRAMUSCULAR | Status: AC
  Administered 2013-02-23: 4 mg via INTRAVENOUS
  Filled 2013-02-23: qty 2

## 2013-02-23 MED ORDER — CLONIDINE HCL 0.1 MG PO TABS
0.1000 mg | ORAL_TABLET | Freq: Two times a day (BID) | ORAL | Status: DC
  Administered 2013-02-23 – 2013-02-26 (×5): 0.1 mg via ORAL
  Filled 2013-02-23 (×8): qty 1

## 2013-02-23 MED ORDER — ENOXAPARIN SODIUM 30 MG/0.3ML ~~LOC~~ SOLN
30.0000 mg | SUBCUTANEOUS | Status: DC
  Administered 2013-02-23 – 2013-02-24 (×2): 30 mg via SUBCUTANEOUS
  Filled 2013-02-23 (×3): qty 0.3

## 2013-02-23 MED ORDER — POTASSIUM CHLORIDE CRYS ER 20 MEQ PO TBCR
40.0000 meq | EXTENDED_RELEASE_TABLET | ORAL | Status: AC
  Administered 2013-02-23 – 2013-02-24 (×2): 40 meq via ORAL
  Filled 2013-02-23 (×2): qty 2

## 2013-02-23 MED ORDER — ASPIRIN 325 MG PO TABS
325.0000 mg | ORAL_TABLET | Freq: Every day | ORAL | Status: DC
  Administered 2013-02-24 – 2013-02-26 (×3): 325 mg via ORAL
  Filled 2013-02-23 (×3): qty 1

## 2013-02-23 MED ORDER — INSULIN ASPART PROT & ASPART (70-30 MIX) 100 UNIT/ML ~~LOC~~ SUSP
14.0000 [IU] | Freq: Two times a day (BID) | SUBCUTANEOUS | Status: DC
  Administered 2013-02-24 – 2013-02-26 (×5): 14 [IU] via SUBCUTANEOUS
  Filled 2013-02-23: qty 10

## 2013-02-23 MED ORDER — ACETAMINOPHEN 650 MG RE SUPP: 650.0000 mg | Freq: Four times a day (QID) | RECTAL | Status: DC | PRN

## 2013-02-23 MED ORDER — SODIUM CHLORIDE 0.9 % IV BOLUS (SEPSIS)
1000.0000 mL | Freq: Once | INTRAVENOUS | Status: AC
  Administered 2013-02-23: 1000 mL via INTRAVENOUS

## 2013-02-23 MED ORDER — PANTOPRAZOLE SODIUM 40 MG PO TBEC
40.0000 mg | DELAYED_RELEASE_TABLET | Freq: Every day | ORAL | Status: DC
  Administered 2013-02-23 – 2013-02-26 (×4): 40 mg via ORAL
  Filled 2013-02-23 (×4): qty 1

## 2013-02-23 MED ORDER — INSULIN NPH ISOPHANE & REGULAR (70-30) 100 UNIT/ML ~~LOC~~ SUSP: 14.0000 [IU] | Freq: Two times a day (BID) | SUBCUTANEOUS | Status: DC

## 2013-02-23 MED ORDER — SODIUM CHLORIDE 0.9 % IV SOLN
INTRAVENOUS | Status: DC
  Administered 2013-02-23 – 2013-02-25 (×3): via INTRAVENOUS

## 2013-02-23 MED ORDER — SODIUM CHLORIDE 0.9 % IV SOLN: INTRAVENOUS | Status: DC

## 2013-02-23 MED ORDER — MORPHINE SULFATE 2 MG/ML IJ SOLN: 1.0000 mg | INTRAMUSCULAR | Status: DC | PRN

## 2013-02-23 MED ORDER — SENNOSIDES-DOCUSATE SODIUM 8.6-50 MG PO TABS
1.0000 | ORAL_TABLET | Freq: Every evening | ORAL | Status: DC | PRN
  Filled 2013-02-23: qty 1

## 2013-02-23 MED ORDER — ONDANSETRON HCL 4 MG/2ML IJ SOLN: 4.0000 mg | Freq: Four times a day (QID) | INTRAMUSCULAR | Status: DC | PRN

## 2013-02-23 MED ORDER — ACETAMINOPHEN 325 MG PO TABS: 650.0000 mg | ORAL_TABLET | Freq: Four times a day (QID) | ORAL | Status: DC | PRN

## 2013-02-23 MED ORDER — ONDANSETRON HCL 4 MG PO TABS: 4.0000 mg | ORAL_TABLET | Freq: Four times a day (QID) | ORAL | Status: DC | PRN

## 2013-02-23 NOTE — ED Notes (Signed)
Pt states was discharged from hospital couple weeks ago w/ difficulty w/ urination, placed on medication to help urinate, states still having difficulty w/ urination. 2 days ago started having weakness, shaking and vomiting, states unable to keep down anything, today has not been able to take medications d/t vomiting. Pt also states feels short of breath.

## 2013-02-23 NOTE — Progress Notes (Signed)
ANTIBIOTIC CONSULT NOTE - INITIAL  Pharmacy Consult for Ceftriaxone Indication: UTI  Allergies  Allergen Reactions  . Tetracyclines & Related Other (See Comments)    Breaks her mouth out  . Metformin     Doctor doesn't want pt to take because it will scar her lungs    Patient Measurements: Height: 5\' 8"  (172.7 cm) Weight: 233 lb 4 oz (105.8 kg) IBW/kg (Calculated) : 63.9 Adjusted Body Weight:   Vital Signs: Temp: 98.7 F (37.1 C) (05/12 2029) Temp src: Oral (05/12 2029) BP: 109/77 mmHg (05/12 2029) Pulse Rate: 83 (05/12 2029) Intake/Output from previous day:   Intake/Output from this shift:    Labs:  Recent Labs  02/23/13 1505  WBC 19.3*  HGB 15.8*  PLT 314  CREATININE 2.17*   Estimated Creatinine Clearance: 27.7 ml/min (by C-G formula based on Cr of 2.17). No results found for this basename: VANCOTROUGH, Leodis Binet, VANCORANDOM, GENTTROUGH, GENTPEAK, GENTRANDOM, TOBRATROUGH, TOBRAPEAK, TOBRARND, AMIKACINPEAK, AMIKACINTROU, AMIKACIN,  in the last 72 hours   Microbiology: Recent Results (from the past 720 hour(s))  URINE CULTURE     Status: None   Collection Time    01/28/13  4:18 PM      Result Value Range Status   Colony Count >=100,000 COLONIES/ML   Final   Organism ID, Bacteria Multiple bacterial morphotypes present, none   Final   Organism ID, Bacteria predominant. Suggest appropriate recollection if    Final   Organism ID, Bacteria clinically indicated.   Final    Medical History: Past Medical History  Diagnosis Date  . HBP (high blood pressure)   . CHF (congestive heart failure)   . HLD (hyperlipidemia)   . Morbid obesity     PFTs 07/14/08 moderate restriction vital capacity 58% with ERV 32% DLCO 38, corrects to 108  . Right heart enlargement     CT 07/16/08. nocturnal desat x 25 min 07/29/08  . Atrial fibrillation   . Diabetes mellitus without complication   . PONV (postoperative nausea and vomiting)     Medications:  Scheduled:  . [START ON  02/24/2013] aspirin  325 mg Oral Daily  . cloNIDine  0.1 mg Oral BID  . enoxaparin (LOVENOX) injection  30 mg Subcutaneous Q24H  . FLUoxetine  40 mg Oral Daily  . insulin aspart protamine- aspart  14 Units Subcutaneous BID WC  . nebivolol  10 mg Oral Daily  . [COMPLETED] ondansetron  4 mg Intravenous Once  . pantoprazole  40 mg Oral Daily  . potassium chloride  40 mEq Oral Q4H  . [COMPLETED] sodium chloride  1,000 mL Intravenous Once  . [DISCONTINUED] sodium chloride   Intravenous STAT  . [DISCONTINUED] FLUoxetine  40 mg Oral Daily  . [DISCONTINUED] FLUoxetine  40 mg Oral Daily  . [DISCONTINUED] insulin NPH-regular  14 Units Subcutaneous BID WC   Infusions:  . sodium chloride 75 mL/hr at 02/23/13 2042   PRN: acetaminophen, acetaminophen, morphine injection, ondansetron (ZOFRAN) IV, ondansetron, senna-docusate Assessment: 79 yo F with UTI suspected.  Symptoms of nausea/vomiting  Goal of Therapy:  Eradication of infection  Plan:  Begin Cefriaxone 2 Gm IV q 24 hours. Follow up culture results Eradication of infection  Loletta Specter 02/23/2013,9:55 PM

## 2013-02-23 NOTE — ED Notes (Signed)
Patient transported to CT 

## 2013-02-23 NOTE — ED Notes (Signed)
Pt complains of vomiting and weakness and unable to urinate. Pt was seen last week and was admitted with the "same symptoms"

## 2013-02-23 NOTE — ED Provider Notes (Signed)
History     CSN: [id number]  Arrival date & time 02/23/13  1344   First MD Initiated Contact with Patient 02/23/13 1423      Chief Complaint  Patient presents with  . Emesis    (Consider location/radiation/quality/duration/timing/severity/associated sxs/prior treatment) Patient is a 79 y.o. female presenting with vomiting. The history is provided by the patient and a relative.  Emesis Severity:  Severe Duration:  2 days Timing:  Constant Number of daily episodes:  Multiple Quality:  Stomach contents Progression:  Worsening Chronicity:  New Recent urination:  Decreased Relieved by:  Nothing Worsened by:  Liquids Ineffective treatments:  None tried Associated symptoms: abdominal pain and cough   Associated symptoms: no chills, no diarrhea and no fever   Associated symptoms comment:  Sob Abdominal pain:    Location:  Suprapubic   Quality:  Aching, gnawing and pressure (also some flank pain bilaterally)   Severity:  Moderate   Onset quality:  Gradual   Duration:  1 week   Timing:  Constant   Progression:  Worsening   Chronicity:  Recurrent Risk factors: diabetes   Risk factors comment:  Patient states she was recently in the hospital at [facility] for suprapubic pain and shortness of breath and was found to have congestive heart failure but new UTI. She was discharged approximately 3 weeks ago and over the last 1 week she's had worsening sy   Past Medical History  Diagnosis Date  . HBP (high blood pressure)   . CHF (congestive heart failure)   . HLD (hyperlipidemia)   . Morbid obesity     PFTs 07/14/08 moderate restriction vital capacity 58% with ERV 32% DLCO 38, corrects to 108  . Right heart enlargement     CT 07/16/08. nocturnal desat x 25 min 07/29/08  . Atrial fibrillation   . Diabetes mellitus without complication     Past Surgical History  Procedure Laterality Date  . Broken wrist      x2  . Cataract extraction    . Abdominal hysterectomy      abd?  .  Breast surgery      Family History  Problem Relation Age of Onset  . Heart disease Father   . Heart disease Mother     also rheumatism  . Heart disease Brother     sibilings  . Cancer Maternal Aunt     History  Substance Use Topics  . Smoking status: Former Smoker -- 1.00 packs/day for 20 years    Types: Cigarettes    Quit date: 10/16/1979  . Smokeless tobacco: Not on file  . Alcohol Use: No    OB History   Grav Para Term Preterm Abortions TAB SAB Ect Mult Living                  Review of Systems  Constitutional: Negative for chills.  Respiratory: Positive for cough and shortness of breath.   Cardiovascular: Positive for leg swelling.  Gastrointestinal: Positive for nausea, vomiting and abdominal pain. Negative for diarrhea.  Genitourinary: Positive for frequency. Negative for dysuria.  All other systems reviewed and are negative.    Allergies  Metformin  Home Medications   Current Outpatient Rx  Name  Route  Sig  Dispense  Refill  . aspirin 325 MG tablet   Oral   Take 325 mg by mouth daily.         . cloNIDine (CATAPRES) 0.1 MG tablet   Oral   Take 0.1 mg by  mouth 2 (two) times daily.         . famotidine (PEPCID) 20 MG tablet   Oral   Take 20 mg by mouth 2 (two) times daily.         Marland Kitchen FLUoxetine (PROZAC) 40 MG capsule   Oral   Take 40 mg by mouth daily.         . furosemide (LASIX) 40 MG tablet   Oral   Take 40 mg by mouth 3 (three) times daily.         Marland Kitchen ibuprofen (ADVIL,MOTRIN) 200 MG tablet   Oral   Take 600 mg by mouth every 6 (six) hours as needed for pain.          . Insulin Isophane & Regular (HUMULIN 70/30 [place])   Subcutaneous   Inject 14 Units into the skin 2 (two) times daily.         . metolazone (ZAROXOLYN) 5 MG tablet   Oral   Take 2.5 mg by mouth every other day.         . nebivolol (BYSTOLIC) 10 MG tablet   Oral   Take 10 mg by mouth daily.         Marland Kitchen omeprazole (PRILOSEC) 20 MG capsule   Oral   Take  20 mg by mouth daily.         . potassium chloride SA (K-DUR,KLOR-CON) 20 MEQ tablet   Oral   Take 20 mEq by mouth daily.         . predniSONE (DELTASONE) 10 MG tablet   Oral   Take 10 mg by mouth daily. Take six tablets by mouth once daily for 4 days, then 4 tablets once daily for 4 days, then 2 tablets once daily for 4 days, then stop. Filled on May 1st.           BP 121/94  Pulse 124  Temp(Src) 97.8 F (36.6 C) (Oral)  Resp 28  SpO2 96%  Physical Exam  Nursing note and vitals reviewed. Constitutional: She is oriented to person, place, and time. She appears well-developed and well-nourished. She appears distressed.  Morbid obesity  HENT:  Head: Normocephalic and atraumatic.  Mouth/Throat: Oropharynx is clear and moist. Mucous membranes are dry.  Eyes: Conjunctivae and EOM are normal. Pupils are equal, round, and reactive to light.  Neck: Normal range of motion. Neck supple.  Cardiovascular: Intact distal pulses.  An irregularly irregular rhythm present. Tachycardia present.   No murmur heard. Pulmonary/Chest: Tachypnea noted. No respiratory distress. She has no wheezes. She has rales in the right lower field and the left lower field.  Abdominal: Soft. She exhibits no distension. There is tenderness in the suprapubic area. There is no rebound and no guarding.  Musculoskeletal: Normal range of motion. She exhibits no edema and no tenderness.  Neurological: She is alert and oriented to person, place, and time.  Skin: Skin is warm and dry. No rash noted. No erythema.  Psychiatric: She has a normal mood and affect. Her behavior is normal.    ED Course  Procedures (including critical care time)  Labs Reviewed  CBC WITH DIFFERENTIAL - Abnormal; Notable for the following:    WBC 19.3 (*)    RBC 5.35 (*)    Hemoglobin 15.8 (*)    MCHC 36.7 (*)    Neutrophils Relative 81 (*)    Lymphocytes Relative 9 (*)    Neutro Abs 15.7 (*)    Monocytes Absolute 1.9 (*)  All  other components within normal limits  COMPREHENSIVE METABOLIC PANEL - Abnormal; Notable for the following:    Sodium 126 (*)    Potassium 3.0 (*)    Chloride 74 (*)    Glucose, Bld 514 (*)    BUN 54 (*)    Creatinine, Ser 2.17 (*)    Total Bilirubin 3.2 (*)    GFR calc non Af Amer 21 (*)    GFR calc Af Amer 24 (*)    All other components within normal limits  PRO B NATRIURETIC PEPTIDE - Abnormal; Notable for the following:    Pro B Natriuretic peptide (BNP) 1085.0 (*)    All other components within normal limits  GLUCOSE, CAPILLARY - Abnormal; Notable for the following:    Glucose-Capillary 543 (*)    All other components within normal limits  CG4 I-STAT (LACTIC ACID) - Abnormal; Notable for the following:    Lactic Acid, Venous 6.21 (*)    All other components within normal limits  LIPASE, BLOOD  URINALYSIS, ROUTINE W REFLEX MICROSCOPIC  POCT I-STAT TROPONIN I   Dg Chest 2 View  02/23/2013  *RADIOLOGY REPORT*  Clinical Data: Cough, shortness of breath, nausea, history of CHF  CHEST - 2 VIEW  Comparison: 02/04/2013  Findings: Enlargement of cardiac silhouette. Stable mediastinal contours. Chronic interstitial lung disease changes identified in the lungs bilaterally, greatest in the right upper lobe and left mid lung. Minimal thickening at the minor fissure. No definite acute infiltrate, pleural effusion or pneumothorax. Osseous demineralization. Question calcification versus artifact projecting over the mid left lung.  IMPRESSION: Chronic interstitial lung disease changes/fibrosis. No acute abnormalities.   Original Report Authenticated By: Ulyses Southward, M.D.    Ct Head Wo Contrast  02/23/2013  *RADIOLOGY REPORT*  Clinical Data: Difficulty urinating.  Weakness, shaking and vomiting.  CT HEAD WITHOUT CONTRAST  Technique:  Contiguous axial images were obtained from the base of the skull through the vertex without contrast.  Comparison: None.  Findings: No intracranial hemorrhage.   Hypodensity right paracentral medulla and right aspect of the cerebellum may be related to streak artifact although difficult to exclude changes of acute infarct.  Small vessel disease type changes.  Mild global atrophy without hydrocephalus.  No intracranial mass lesion detected on this unenhanced exam.  Vascular calcifications.  Air-fluid level left maxillary sinus raising possibility acute sinusitis.  Opacification anterior right ethmoid sinus air cell.  IMPRESSION: No intracranial hemorrhage.  Hypodensity right paracentral medulla and right aspect of the cerebellum may be related to streak artifact although difficult to exclude changes of acute infarct.  Small vessel disease type changes.  Mild global atrophy without hydrocephalus.  Air-fluid level left maxillary sinus raising possibility acute sinusitis.   Original Report Authenticated By: Lacy Duverney, M.D.     Date: 02/23/2013  Rate: 100  Rhythm: atrial fibrillation and premature ventricular contractions (PVC)  QRS Axis: left  Intervals: normal  ST/T Wave abnormalities: nonspecific ST/T changes  Conduction Disutrbances:LVH  Narrative Interpretation:   Old EKG Reviewed: none available    1. Dehydration   2. Acute renal insufficiency   3. Vomiting   4. Hypokalemia   5. Hyponatremia       MDM    Patient with a history of recent hospitalization approximately 3 weeks ago because of shortness of breath and congestive heart failure who presents today with worsening shortness of breath, vomiting for the last 2 days and unable to tolerate po's and suprapubic abdominal pain. Patient states that she has  a history of bladder prolapse and frequent UTIs but denies any fever. She has a history of A. fib is currently not on any anticoagulation. She denies any chest pain but states she's had worsening shortness of breath. She's been taking Lasix and Zaroxolyn and states her urine output has decreased significantly. On exam she is ill appearing and  tachycardic but normal blood pressure. Family has been monitoring at home and states her blood pressure and heart rate have been normal. She however has not had any of her medications today due to persistent vomiting. On exam she does not have signs of urinary retention by bedside ultrasound. She is neurovascularly intact. Concern for cardiac etiology versus dehydration and new renal failure versus UTI as cause for her symptoms.   CBC, CMP, UA, lipase, BMP, ALT lactic acid, troponin, chest x-ray, EKG pending. Patient given IV Zofran.   3:39 PM EKG with non-specific findings of ST depression and no old to compare.  Today lactate is 6.2 and CXR without signs of overload.  Will start IVF and rest of labs pending.  4:15 PM Patient with new acute renal failure with a creatinine of 2.17 when she's always told her creatinine was normal a code H. anemia 126 and hypokalemia 3.0.  Feel that the nausea and vomiting could be due to severe dehydration versus pyelonephritis versus uremia.  Also on reevaluation patient states she's had some dizziness and headache so we'll do a CT to insure no other cause for the nausea and vomiting. Patient has a leukocytosis of 19,000 but unclear the source as patient does not have pneumonia could be reactive from persistent vomiting or could be from a urinary tract infection UA is still pending. Will admit for IV fluids and further management of electrolyte abnormalities.  CRITICAL CARE Performed by: Gwyneth Sprout Total critical care time: 30 Critical care time was exclusive of separately billable procedures and treating other patients. Critical care was necessary to treat or prevent imminent or life-threatening deterioration. Critical care was time spent personally by me on the following activities: development of treatment plan with patient and/or surrogate as well as nursing, discussions with consultants, evaluation of patient's response to treatment, examination of  patient, obtaining history from patient or surrogate, ordering and performing treatments and interventions, ordering and review of laboratory studies, ordering and review of radiographic studies, pulse oximetry and re-evaluation of patient's condition.    Gwyneth Sprout, MD 02/23/13 1655

## 2013-02-23 NOTE — ED Notes (Signed)
Patient transported to X-ray 

## 2013-02-23 NOTE — H&P (Signed)
[facility]          History and Physical    PCP:   Blane Ohara, MD   Chief Complaint:  Nausea and vomiting  HPI: Patient is a 79 year old obese white woman with past medical history significant for congestive heart failure, unknown type, hyperlipidemia, hypertension, history of atrial fibrillation not maintained on chronic anticoagulation, chronic respiratory failure of unknown origin on chronic oxygen as well as diabetes. She was at [facility] 3 weeks ago and was treated for congestive heart failure exacerbation. Was started on Zaroxolyn that admission. 2 days ago she started developing severe nausea and vomiting. She has not been able to keep anything down. She refused to go back to [place] and asked her daughter to drive her to [place]. Here she is found to be severely dehydrated with leukocytosis and hyperglycemic. We have been asked to admit her for further evaluation and management.  Allergies:   Allergies  Allergen Reactions  . Tetracyclines & Related Other (See Comments)    Breaks her mouth out  . Metformin     Doctor doesn't want pt to take because it will scar her lungs      Past Medical History  Diagnosis Date  . HBP (high blood pressure)   . CHF (congestive heart failure)   . HLD (hyperlipidemia)   . Morbid obesity     PFTs 07/14/08 moderate restriction vital capacity 58% with ERV 32% DLCO 38, corrects to 108  . Right heart enlargement     CT 07/16/08. nocturnal desat x 25 min 07/29/08  . Atrial fibrillation   . Diabetes mellitus without complication   . PONV (postoperative nausea and vomiting)     Past Surgical History  Procedure Laterality Date  . Broken wrist      x2  . Cataract extraction    . Abdominal hysterectomy      abd?  . Breast surgery      Prior to Admission medications   Medication Sig Start Date End Date Taking? Authorizing Provider  aspirin 325 MG tablet Take 325 mg by mouth daily.   Yes Historical Provider, MD   cloNIDine (CATAPRES) 0.1 MG tablet Take 0.1 mg by mouth 2 (two) times daily.   Yes Historical Provider, MD  famotidine (PEPCID) 20 MG tablet Take 20 mg by mouth 2 (two) times daily.   Yes Historical Provider, MD  FLUoxetine (PROZAC) 40 MG capsule Take 40 mg by mouth daily.   Yes Historical Provider, MD  furosemide (LASIX) 40 MG tablet Take 40 mg by mouth 3 (three) times daily.   Yes Historical Provider, MD  ibuprofen (ADVIL,MOTRIN) 200 MG tablet Take 600 mg by mouth every 6 (six) hours as needed for pain.    Yes Historical Provider, MD  Insulin Isophane & Regular (HUMULIN 70/30 [place]) Inject 14 Units into the skin 2 (two) times daily.   Yes Historical Provider, MD  metolazone (ZAROXOLYN) 5 MG tablet Take 2.5 mg by mouth every other day.   Yes Historical Provider, MD  nebivolol (BYSTOLIC) 10 MG tablet Take 10 mg by mouth daily.   Yes Historical Provider, MD  omeprazole (PRILOSEC) 20 MG capsule Take 20 mg by mouth daily.   Yes Historical Provider, MD  potassium chloride SA (K-DUR,KLOR-CON) 20 MEQ tablet Take 20 mEq by mouth daily.   Yes Historical Provider, MD  predniSONE (DELTASONE) 10 MG tablet Take 10 mg by mouth daily. Take six tablets by mouth once daily for 4 days, then 4 tablets once daily for  4 days, then 2 tablets once daily for 4 days, then stop. Filled on May 1st.   Yes Historical Provider, MD    Social History:  reports that she quit smoking about 33 years ago. Her smoking use included Cigarettes. She has a 20 pack-year smoking history. She has never used smokeless tobacco. She reports that she does not drink alcohol or use illicit drugs.  Family History  Problem Relation Age of Onset  . Heart disease Father   . Heart disease Mother     also rheumatism  . Heart disease Brother     sibilings  . Cancer Maternal Aunt     Review of Systems:  Constitutional: Denies fever, chills, diaphoresis. HEENT: Denies photophobia, eye pain, redness, hearing loss, ear pain, congestion, sore  throat, rhinorrhea, sneezing, mouth sores, trouble swallowing, neck pain, neck stiffness and tinnitus.   Respiratory: Denies cough, chest tightness,  and wheezing.   Cardiovascular: Denies chest pain, palpitations and leg swelling.  Gastrointestinal: Denies abdominal pain, diarrhea, constipation, blood in stool and abdominal distention.  Genitourinary: Denies dysuria, urgency, frequency, hematuria, flank pain and difficulty urinating.  Endocrine: Denies: hot or cold intolerance, sweats, changes in hair or nails, polyuria, polydipsia. Musculoskeletal: Denies myalgias, back pain, joint swelling, arthralgias and gait problem.  Skin: Denies pallor, rash and wound.  Neurological: Denies dizziness, seizures, syncope, weakness, light-headedness, numbness and headaches.  Hematological: Denies adenopathy. Easy bruising, personal or family bleeding history  Psychiatric/Behavioral: Denies suicidal ideation, mood changes, confusion, nervousness, sleep disturbance and agitation   Physical Exam: Blood pressure 131/78, pulse 81, temperature 98.1 F (36.7 C), temperature source Axillary, resp. rate 20, SpO2 97.00%. General: Alert, awake, oriented x3. HEENT: Normocephalic, atraumatic, pupils equal round and reactive to light, extraocular movements intact, very dry mucous membranes with cracked lips and tongue. Neck: Supple, no JVD, no lymphadenopathy, no bruits, no goiter. Cardiovascular: Tachycardic, regular, no murmurs, rubs or gallops. Lungs: Clear to auscultation bilaterally. Abdomen: Obese, soft, nontender, nondistended, positive bowel sounds. Extremities: No clubbing, cyanosis or edema, positive pedal pulses. Neurologic: Grossly intact and nonfocal.  Labs on Admission:  Results for orders placed during the hospital encounter of 02/23/13 (from the past 48 hour(s))  GLUCOSE, CAPILLARY     Status: Abnormal   Collection Time    02/23/13  3:00 PM      Result Value Range   Glucose-Capillary 543 (*) 70  - 99 mg/dL  CBC WITH DIFFERENTIAL     Status: Abnormal   Collection Time    02/23/13  3:05 PM      Result Value Range   WBC 19.3 (*) 4.0 - 10.5 K/uL   RBC 5.35 (*) 3.87 - 5.11 MIL/uL   Hemoglobin 15.8 (*) 12.0 - 15.0 g/dL   HCT 40.9  81.1 - 91.4 %   MCV 80.6  78.0 - 100.0 fL   MCH 29.5  26.0 - 34.0 pg   MCHC 36.7 (*) 30.0 - 36.0 g/dL   Comment: PRE-WARMING TECHNIQUE USED   RDW 13.4  11.5 - 15.5 %   Platelets 314  150 - 400 K/uL   Neutrophils Relative 81 (*) 43 - 77 %   Lymphocytes Relative 9 (*) 12 - 46 %   Monocytes Relative 10  3 - 12 %   Eosinophils Relative 0  0 - 5 %   Basophils Relative 0  0 - 1 %   Neutro Abs 15.7 (*) 1.7 - 7.7 K/uL   Lymphs Abs 1.7  0.7 - 4.0 K/uL  Monocytes Absolute 1.9 (*) 0.1 - 1.0 K/uL   Eosinophils Absolute 0.0  0.0 - 0.7 K/uL   Basophils Absolute 0.0  0.0 - 0.1 K/uL  COMPREHENSIVE METABOLIC PANEL     Status: Abnormal   Collection Time    02/23/13  3:05 PM      Result Value Range   Sodium 126 (*) 135 - 145 mEq/L   Comment: REPEATED TO VERIFY   Potassium 3.0 (*) 3.5 - 5.1 mEq/L   Chloride 74 (*) 96 - 112 mEq/L   Comment: REPEATED TO VERIFY   CO2 28  19 - 32 mEq/L   Comment: REPEATED TO VERIFY   Glucose, Bld 514 (*) 70 - 99 mg/dL   BUN 54 (*) 6 - 23 mg/dL   Creatinine, Ser 5.62 (*) 0.50 - 1.10 mg/dL   Calcium 9.2  8.4 - 13.0 mg/dL   Total Protein 6.7  6.0 - 8.3 g/dL   Albumin 3.6  3.5 - 5.2 g/dL   AST 14  0 - 37 U/L   ALT 11  0 - 35 U/L   Alkaline Phosphatase 105  39 - 117 U/L   Total Bilirubin 3.2 (*) 0.3 - 1.2 mg/dL   GFR calc non Af Amer 21 (*) >90 mL/min   GFR calc Af Amer 24 (*) >90 mL/min   Comment:            The eGFR has been calculated     using the CKD EPI equation.     This calculation has not been     validated in all clinical     situations.     eGFR's persistently     <90 mL/min signify     possible Chronic Kidney Disease.  LIPASE, BLOOD     Status: None   Collection Time    02/23/13  3:05 PM      Result Value  Range   Lipase 46  11 - 59 U/L  PRO B NATRIURETIC PEPTIDE     Status: Abnormal   Collection Time    02/23/13  3:05 PM      Result Value Range   Pro B Natriuretic peptide (BNP) 1085.0 (*) 0 - 450 pg/mL  CG4 I-STAT (LACTIC ACID)     Status: Abnormal   Collection Time    02/23/13  3:17 PM      Result Value Range   Lactic Acid, Venous 6.21 (*) 0.5 - 2.2 mmol/L  POCT I-STAT TROPONIN I     Status: None   Collection Time    02/23/13  3:17 PM      Result Value Range   Troponin i, poc 0.02  0.00 - 0.08 ng/mL   Comment 3            Comment: Due to the release kinetics of cTnI,     a negative result within the first hours     of the onset of symptoms does not rule out     myocardial infarction with certainty.     If myocardial infarction is still suspected,     repeat the test at appropriate intervals.    Radiological Exams on Admission: Dg Chest 2 View  02/23/2013  *RADIOLOGY REPORT*  Clinical Data: Cough, shortness of breath, nausea, history of CHF  CHEST - 2 VIEW  Comparison: 02/04/2013  Findings: Enlargement of cardiac silhouette. Stable mediastinal contours. Chronic interstitial lung disease changes identified in the lungs bilaterally, greatest in the right upper lobe and left mid lung.  Minimal thickening at the minor fissure. No definite acute infiltrate, pleural effusion or pneumothorax. Osseous demineralization. Question calcification versus artifact projecting over the mid left lung.  IMPRESSION: Chronic interstitial lung disease changes/fibrosis. No acute abnormalities.   Original Report Authenticated By: Ulyses Southward, M.D.    Ct Head Wo Contrast  02/23/2013  *RADIOLOGY REPORT*  Clinical Data: Difficulty urinating.  Weakness, shaking and vomiting.  CT HEAD WITHOUT CONTRAST  Technique:  Contiguous axial images were obtained from the base of the skull through the vertex without contrast.  Comparison: None.  Findings: No intracranial hemorrhage.  Hypodensity right paracentral medulla and  right aspect of the cerebellum may be related to streak artifact although difficult to exclude changes of acute infarct.  Small vessel disease type changes.  Mild global atrophy without hydrocephalus.  No intracranial mass lesion detected on this unenhanced exam.  Vascular calcifications.  Air-fluid level left maxillary sinus raising possibility acute sinusitis.  Opacification anterior right ethmoid sinus air cell.  IMPRESSION: No intracranial hemorrhage.  Hypodensity right paracentral medulla and right aspect of the cerebellum may be related to streak artifact although difficult to exclude changes of acute infarct.  Small vessel disease type changes.  Mild global atrophy without hydrocephalus.  Air-fluid level left maxillary sinus raising possibility acute sinusitis.   Original Report Authenticated By: Lacy Duverney, M.D.     Assessment/Plan Principal Problem:   Nausea and vomiting Active Problems:   PULMONARY HYPERTENSION, SECONDARY   Chronic respiratory failure   Dehydration   ARF (acute renal failure)   Hyponatremia   DM ketoacidosis type II, uncontrolled   Leukocytosis   Lactic acidosis   Nausea and vomiting -Cause unclear to me at this point. -She does have a history of repeated UTIs in the nurse in the ED he reported to me that her urine appeared to have a lot of sediment, so UTI/pyelonephritis is a possibility. However urinalysis is pending at time of my evaluation. -Viral gastroenteritis seems unlikely without diarrhea. -For now we'll treat symptomatically with anti-medics and IV fluids. -She does not complain of abdominal pain and as such did not see need for CT scan at this point.  Severe dehydration -As evidence by multiple electrolyte abnormalities including hyponatremia, hypochloremia, hypokalemia as well as acute renal failure. -Suspect she has been over diuresed over the past 4 weeks following her admission for congestive heart failure. -Will hold her Lasix and Zaroxolyn for  now and treat her with some IV fluids at 75 cc an hour.  Lactic acidosis -Suspect related to dehydration and decreased circulatory volume more so than active infection although UTI has not been completely ruled out at this point. -As above treat symptomatically with IV fluids.  Acute renal failure -I believe secondary to over diuresis. -Hold diuretics start IV fluids.   Chronic Congestive heart failure, type unknown -We'll order a 2-D echo as I have no documentation of her ejection fraction.  Diabetes -Will continue on her home dose of 7030 insulin despite the fact that she is having nausea and vomiting and she is hyperglycemic at time of admission. -We'll need to closely follow her sugars and adjust as needed.  Chronic respiratory failure -Followed by Dr. Shona Simpson Etiology remains unclear. [name] suspects that this is likely related to pulmonary hypertension from sleep apnea on top of possibly interstitial lung disease. She does wear chronic oxygen which we will continue this hospitalization.  DVT prophylaxis -Lovenox.   Time Spent on Admission: 75 minutes  HERNANDEZ ACOSTA,ESTELA [facility] Pager: [phone]  02/23/2013, 6:10 PM

## 2013-02-24 DIAGNOSIS — E876 Hypokalemia: Secondary | ICD-10-CM

## 2013-02-24 DIAGNOSIS — I517 Cardiomegaly: Secondary | ICD-10-CM

## 2013-02-24 DIAGNOSIS — N390 Urinary tract infection, site not specified: Secondary | ICD-10-CM | POA: Diagnosis present

## 2013-02-24 LAB — URINALYSIS, ROUTINE W REFLEX MICROSCOPIC
Glucose, UA: >1000 mg/dL — AB
Ketones, ur: NEGATIVE mg/dL
Protein, ur: NEGATIVE mg/dL

## 2013-02-24 LAB — GLUCOSE, CAPILLARY
Glucose-Capillary: 339 mg/dL — ABNORMAL HIGH (ref 70–99)
Glucose-Capillary: 210 mg/dL — ABNORMAL HIGH (ref 70–99)

## 2013-02-24 LAB — URINE MICROSCOPIC-ADD ON

## 2013-02-24 LAB — BASIC METABOLIC PANEL
BUN: 52 mg/dL — ABNORMAL HIGH (ref 6–23)
CO2: 29 mEq/L (ref 19–32)
Chloride: 82 mEq/L — ABNORMAL LOW (ref 96–112)
GFR calc Af Amer: 27 mL/min — ABNORMAL LOW (ref >90)
Glucose, Bld: 416 mg/dL — ABNORMAL HIGH (ref 70–99)
Potassium: 3 mEq/L — ABNORMAL LOW (ref 3.5–5.1)

## 2013-02-24 LAB — CBC
Hemoglobin: 12.9 g/dL (ref 12.0–15.0)
MCH: 29.5 pg (ref 26.0–34.0)
MCV: 81.1 fL (ref 78.0–100.0)
Platelets: 217 10*3/uL (ref 150–400)
RBC: 4.38 MIL/uL (ref 3.87–5.11)

## 2013-02-24 MED ORDER — ZOLPIDEM TARTRATE 5 MG PO TABS
5.0000 mg | ORAL_TABLET | Freq: Once | ORAL | Status: AC
  Administered 2013-02-24: 5 mg via ORAL
  Filled 2013-02-24: qty 1

## 2013-02-24 MED ORDER — INSULIN ASPART 100 UNIT/ML ~~LOC~~ SOLN
5.0000 [IU] | Freq: Once | SUBCUTANEOUS | Status: AC
  Administered 2013-02-24: 5 [IU] via SUBCUTANEOUS

## 2013-02-24 MED ORDER — HYDROCOD POLST-CHLORPHEN POLST 10-8 MG/5ML PO LQCR
5.0000 mL | Freq: Once | ORAL | Status: AC
  Administered 2013-02-24: 5 mL via ORAL
  Filled 2013-02-24: qty 5

## 2013-02-24 MED ORDER — SODIUM CHLORIDE 0.9 % IV BOLUS (SEPSIS)
250.0000 mL | Freq: Once | INTRAVENOUS | Status: AC
  Administered 2013-02-24: 250 mL via INTRAVENOUS

## 2013-02-24 MED ORDER — POTASSIUM CHLORIDE 10 MEQ/100ML IV SOLN
10.0000 meq | INTRAVENOUS | Status: AC
  Administered 2013-02-24 (×3): 10 meq via INTRAVENOUS
  Filled 2013-02-24 (×3): qty 100

## 2013-02-24 MED ORDER — SODIUM CHLORIDE 0.9 % IV BOLUS (SEPSIS): 500.0000 mL | Freq: Once | INTRAVENOUS | Status: DC

## 2013-02-24 MED ORDER — MENTHOL 3 MG MT LOZG
1.0000 | LOZENGE | OROMUCOSAL | Status: DC | PRN
Start: 2013-02-24 — End: 2013-02-26
  Filled 2013-02-24: qty 9

## 2013-02-24 NOTE — Progress Notes (Signed)
Patient complaining of headache and dry mouth, took blood glucose, results 410,

## 2013-02-24 NOTE — Progress Notes (Signed)
CARE MANAGEMENT NOTE 02/24/2013  Patient:  SHAKYLA, NOLLEY   Account Number:  [id number]  Date Initiated:  02/24/2013  Documentation initiated by:  Dieter Hane  Subjective/Objective Assessment:   pt with sepsis and uti ams, hyponatremia, elevated lactic acid. multiple medical problems     Action/Plan:   from home has been indep in his daily activites   Anticipated DC Date:  02/27/2013   Anticipated DC Plan:  HOME/SELF CARE  In-house referral  NA      DC Planning Services  NA      [facility] Choice  NA   Choice offered to / List presented to:  NA   DME arranged  NA      DME agency  NA     HH arranged  NA      HH agency  NA   Status of service:  In process, will continue to follow Medicare Important Message given?  NA - LOS <3 / Initial given by admissions (If response is "NO", the following Medicare IM given date fields will be blank) Date Medicare IM given:   Date Additional Medicare IM given:    Discharge Disposition:    Per UR Regulation:  Reviewed for med. necessity/level of care/duration of stay  If discussed at Long Length of Stay Meetings, dates discussed:    Comments:  [id number] Earlene Plater, RN, BSN, CCM:  CHART REVIEWED AND UPDATED.  Next chart review due on [id number]. NO DISCHARGE NEEDS PRESENT AT THIS TIME. CASE MANAGEMENT [phone]

## 2013-02-24 NOTE — Progress Notes (Signed)
Brief Pharmacy Note Rocephin  D2 Rocephin IV for UTI. Cx pending Not a renally dosed abx Will s/o - reconsult if necessary  Gwen Her PharmD  [phone] 02/24/2013 7:40 AM

## 2013-02-24 NOTE — Progress Notes (Signed)
Spoke with pt and daughter at bedside concerning medications and home health needs.  Pt does not have part D and can not enrolled until Nov/pt's daughter. Pt will benefit with medications that are on the $4 list at [facility] or [facility]. Pt would also benefit from generic medications. Pt's daughter is very knowledgeable of the resources that are available for the pt.  Pt's daughter was encouraged to use; www.needmeds.com, for coupons and discounts.

## 2013-02-24 NOTE — Progress Notes (Signed)
*  PRELIMINARY RESULTS* Echocardiogram 2D Echocardiogram has been performed.  Sarah Kirk 02/24/2013, 9:37 AM

## 2013-02-24 NOTE — Progress Notes (Addendum)
[facility] PROGRESS NOTE  Sarah Kirk WUJ:[id number] DOB: 04-Jun-1933 DOA: 02/23/2013 PCP: Sarah Ohara, MD  Assessment/Plan: Nausea and vomiting -Improved. -Suspect from urinary tract infection.  Urinary tract infection -Continue Rocephin pending urine culture data. -Patient has a history of recurrent urinary tract infections. After speaking with her daughter it seems she has been seeing a urologist in [place] and she has a cystocele. They are not happy with the care received in [place] and are currently working with her pulmonologist on a referral to a urologist in [place].  Acute renal failure -Improved with IV fluids. -Suspect due to intravascular volume depletion secondary to both GI losses with acute nausea and vomiting related to her UTI on top of over diuresis for her congestive heart failure during her prior hospitalization.   chronic CHF, type unknown -Appears compensated. -2-D echo pending.  Diabetes mellitus with hyperglycemia -Continue 7030 insulin and adjust as needed.  Chronic respiratory failure -Stable, is on her home oxygen. -Follows with Sarah Kirk for this issue.  Hypokalemia -Secondary to GI losses. -Continue to replete. -Magnesium level within normal limits at 2.3.  DVT prophylaxis -Lovenox.  Code Status: Full code Family Communication: Daughter Sarah Kirk via telephone  Disposition Plan: Not medically ready for discharge   Consultants:  None   Antibiotics:  Rocephin day 1   Subjective: States her nausea and vomiting is improved.  Objective: Filed Vitals:   02/23/13 1346 02/23/13 1705 02/23/13 2029 02/24/13 0449  BP: 121/94 131/78 109/77   Pulse: 124 81 83   Temp: 97.8 F (36.6 C) 98.1 F (36.7 C) 98.7 F (37.1 C) 97.6 F (36.4 C)  TempSrc: Oral Axillary Oral Oral  Resp: 28 20    Height:   5\' 8"  (1.727 m)   Weight:   105.8 kg (233 lb 4 oz)   SpO2: 96% 97% 88% 4%    Intake/Output Summary (Last 24 hours) at 02/24/13  1206 Last data filed at 02/24/13 1035  Gross per 24 hour  Intake    300 ml  Output    750 ml  Net   -450 ml   Filed Weights   02/23/13 2029  Weight: 105.8 kg (233 lb 4 oz)    Exam:   General:  Alert, awake, oriented x3  Cardiovascular: Regular rate and rhythm, no murmurs, rubs or gallops  Respiratory: Clear to auscultation bilaterally  Abdomen: Obese, soft, nontender, nondistended, positive bowel sounds  Extremities: Trace bilateral pitting edema, positive pedal pulses   Neurologic:  Grossly intact and nonfocal  Data Reviewed: Basic Metabolic Panel:  Recent Labs Lab 02/23/13 1505 02/24/13 0400  NA 126* 128*  K 3.0* 3.0*  CL 74* 82*  CO2 28 29  GLUCOSE 514* 416*  BUN 54* 52*  CREATININE 2.17* 1.95*  CALCIUM 9.2 7.9*  MG 2.3  --    Liver Function Tests:  Recent Labs Lab 02/23/13 1505  AST 14  ALT 11  ALKPHOS 105  BILITOT 3.2*  PROT 6.7  ALBUMIN 3.6    Recent Labs Lab 02/23/13 1505  LIPASE 46   No results found for this basename: AMMONIA,  in the last 168 hours CBC:  Recent Labs Lab 02/23/13 1505 02/24/13 0520  WBC 19.3* 12.8*  NEUTROABS 15.7*  --   HGB 15.8* 12.9  HCT 43.1 35.5*  MCV 80.6 81.1  PLT 314 217   Cardiac Enzymes: No results found for this basename: CKTOTAL, CKMB, CKMBINDEX, TROPONINI,  in the last 168 hours BNP (last 3 results)  Recent Labs  01/28/13  1618 02/23/13 1505  PROBNP 204.0* 1085.0*   CBG:  Recent Labs Lab 02/23/13 1500 02/24/13 0557 02/24/13 0709  GLUCAP 543* 410* 376*    No results found for this or any previous visit (from the past 240 hour(s)).   Studies: Dg Chest 2 View  02/23/2013  *RADIOLOGY REPORT*  Clinical Data: Cough, shortness of breath, nausea, history of CHF  CHEST - 2 VIEW  Comparison: 02/04/2013  Findings: Enlargement of cardiac silhouette. Stable mediastinal contours. Chronic interstitial lung disease changes identified in the lungs bilaterally, greatest in the right upper lobe and  left mid lung. Minimal thickening at the minor fissure. No definite acute infiltrate, pleural effusion or pneumothorax. Osseous demineralization. Question calcification versus artifact projecting over the mid left lung.  IMPRESSION: Chronic interstitial lung disease changes/fibrosis. No acute abnormalities.   Original Report Authenticated By: Ulyses Southward, M.D.    Ct Head Wo Contrast  02/23/2013  *RADIOLOGY REPORT*  Clinical Data: Difficulty urinating.  Weakness, shaking and vomiting.  CT HEAD WITHOUT CONTRAST  Technique:  Contiguous axial images were obtained from the base of the skull through the vertex without contrast.  Comparison: None.  Findings: No intracranial hemorrhage.  Hypodensity right paracentral medulla and right aspect of the cerebellum may be related to streak artifact although difficult to exclude changes of acute infarct.  Small vessel disease type changes.  Mild global atrophy without hydrocephalus.  No intracranial mass lesion detected on this unenhanced exam.  Vascular calcifications.  Air-fluid level left maxillary sinus raising possibility acute sinusitis.  Opacification anterior right ethmoid sinus air cell.  IMPRESSION: No intracranial hemorrhage.  Hypodensity right paracentral medulla and right aspect of the cerebellum may be related to streak artifact although difficult to exclude changes of acute infarct.  Small vessel disease type changes.  Mild global atrophy without hydrocephalus.  Air-fluid level left maxillary sinus raising possibility acute sinusitis.   Original Report Authenticated By: Lacy Duverney, M.D.     Scheduled Meds: . aspirin  325 mg Oral Daily  . cefTRIAXone (ROCEPHIN)  IV  2 g Intravenous Q24H  . cloNIDine  0.1 mg Oral BID  . enoxaparin (LOVENOX) injection  30 mg Subcutaneous Q24H  . FLUoxetine  40 mg Oral Daily  . insulin aspart protamine- aspart  14 Units Subcutaneous BID WC  . nebivolol  10 mg Oral Daily  . pantoprazole  40 mg Oral Daily   Continuous  Infusions: . sodium chloride 75 mL/hr at 02/23/13 2042    Principal Problem:   Nausea and vomiting Active Problems:   PULMONARY HYPERTENSION, SECONDARY   Chronic respiratory failure   Dehydration   ARF (acute renal failure)   Hyponatremia   DM ketoacidosis type II, uncontrolled   Leukocytosis   Lactic acidosis   UTI (urinary tract infection)    Time spent: 35 minutes    HERNANDEZ ACOSTA,ESTELA  [facility] Pager [phone]  If 7PM-7AM, please contact night-coverage at [email or web address], password [facility] 02/24/2013, 12:06 PM  LOS: 1 day

## 2013-02-25 ENCOUNTER — Telehealth: Payer: Self-pay | Admitting: Internal Medicine

## 2013-02-25 DIAGNOSIS — N179 Acute kidney failure, unspecified: Secondary | ICD-10-CM

## 2013-02-25 DIAGNOSIS — R109 Unspecified abdominal pain: Secondary | ICD-10-CM | POA: Diagnosis present

## 2013-02-25 DIAGNOSIS — I10 Essential (primary) hypertension: Secondary | ICD-10-CM | POA: Diagnosis present

## 2013-02-25 DIAGNOSIS — E1110 Type 2 diabetes mellitus with ketoacidosis without coma: Secondary | ICD-10-CM

## 2013-02-25 DIAGNOSIS — N390 Urinary tract infection, site not specified: Principal | ICD-10-CM

## 2013-02-25 DIAGNOSIS — F329 Major depressive disorder, single episode, unspecified: Secondary | ICD-10-CM

## 2013-02-25 DIAGNOSIS — E876 Hypokalemia: Secondary | ICD-10-CM | POA: Diagnosis present

## 2013-02-25 DIAGNOSIS — E871 Hypo-osmolality and hyponatremia: Secondary | ICD-10-CM

## 2013-02-25 LAB — CBC
Platelets: 228 10*3/uL (ref 150–400)
RBC: 4.35 MIL/uL (ref 3.87–5.11)
WBC: 10.9 10*3/uL — ABNORMAL HIGH (ref 4.0–10.5)

## 2013-02-25 LAB — URINE CULTURE
Colony Count: >=100000
Colony Count: 85000

## 2013-02-25 LAB — GLUCOSE, CAPILLARY
Glucose-Capillary: 211 mg/dL — ABNORMAL HIGH (ref 70–99)
Glucose-Capillary: 67 mg/dL — ABNORMAL LOW (ref 70–99)

## 2013-02-25 LAB — BASIC METABOLIC PANEL
Calcium: 8.3 mg/dL — ABNORMAL LOW (ref 8.4–10.5)
GFR calc Af Amer: 42 mL/min — ABNORMAL LOW (ref >90)
GFR calc non Af Amer: 36 mL/min — ABNORMAL LOW (ref >90)
Sodium: 134 mEq/L — ABNORMAL LOW (ref 135–145)

## 2013-02-25 MED ORDER — POTASSIUM CHLORIDE CRYS ER 20 MEQ PO TBCR
40.0000 meq | EXTENDED_RELEASE_TABLET | Freq: Two times a day (BID) | ORAL | Status: AC
Start: 2013-02-25 — End: 2013-02-25
  Administered 2013-02-25 (×2): 40 meq via ORAL
  Filled 2013-02-25 (×2): qty 2

## 2013-02-25 NOTE — Progress Notes (Signed)
CRITICAL VALUE ALERT  Critical value received:  K+ 2.6  Date of notification:  02/25/13   Time of notification:  0703  Critical value read back:yes  Nurse who received alert:  Oneita Jolly, RN  MD notified (1st page):  Dr. Elisabeth Pigeon and Tama Gander, NP as it is change of shift.  Time of first page:  0503  MD notified (2nd page):  Time of second page:  Responding MD:    Time MD responded:

## 2013-02-25 NOTE — Progress Notes (Addendum)
[facility] PROGRESS NOTE  Sarah Kirk RUE:[id number] DOB: [birth year] DOA: 02/23/2013 PCP: Blane Ohara, MD  Brief narrative: 79 year old female with past medical history of dyslipidemia, hypertension, diabetes, chronic respiratory failure on home oxygen. Patient presented to [facility] ED 02/23/2013 with nausea and vomiting. In ED, patient was found to have UTI and was started on rocephin. CBC revealed leukocytosis of 19.3. BMET revealed sodium of 126, potassium of 3 and creatinine of 2.17. In regards to elevated creatinine there are no previous values for comparison.  Assessment/Plan:  Principal Problem:   *Nausea and vomiting - Likely secondary to her urinary tract infection - Patient tolerates by mouth intake and nausea and vomiting significantly improved - Continue antiemetics as needed and continue IV fluids  Active Problems:   UTI (urinary tract infection) - Urine culture grows enterococcus species - Continue Rocephin until sensitivity report available   ARF (acute renal failure) - Secondary to urinary tract infection - Creatinine trending down   Hyponatremia - Secondary to dehydration - Continue IV fluids - Followup BMP in am   DM controlled  - A1c on this admission 5.8 indicating good glycemic control - Continue NovoLog 70/30 mix 14 units twice a day   Leukocytosis - Secondary to urinary tract infection   Hypokalemia. - Secondary to GI losses - Being repleted - Followup BMP in the morning   Depression - Continue Prozac   HTN (hypertension) - Continue clonidine 0.1 mg by mouth twice a day and Nebivolol 10 mg daily   Code Status: full code Family Communication: no family at the bedside Disposition Plan: home when stable  Manson Passey, MD  [facility] Pager [phone]  If 7PM-7AM, please contact night-coverage [email or web address] Password [email or web address] 02/25/2013, 1:22 PM   LOS: 2 days   Consultants:  None   Procedures:  None   Antibiotics:  Rocephin 02/23/2013  -->  HPI/Subjective: No acute overnight events.  Objective: Filed Vitals:   02/24/13 0449 02/24/13 1348 02/24/13 2100 02/25/13 0500  BP:  131/91 134/72 110/65  Pulse:  74 76 65  Temp: 97.6 F (36.4 C) 97.5 F (36.4 C) 97.2 F (36.2 C) 98.1 F (36.7 C)  TempSrc: Oral Oral Oral Oral  Resp:  18 20 20   Height:      Weight:      SpO2: 4% 100% 100% 100%    Intake/Output Summary (Last 24 hours) at 02/25/13 1322 Last data filed at 02/25/13 1032  Gross per 24 hour  Intake    320 ml  Output    875 ml  Net   -555 ml    Exam:   General:  Pt is alert, follows commands appropriately, not in acute distress  Cardiovascular: Regular rate and rhythm, S1/S2, no murmurs, no rubs, no gallops  Respiratory: Clear to auscultation bilaterally, no wheezing, no crackles, no rhonchi  Abdomen: Soft, non tender, non distended, bowel sounds present, no guarding  Extremities: No edema, pulses DP and PT palpable bilaterally  Neuro: Grossly nonfocal  Data Reviewed: Basic Metabolic Panel:  Recent Labs Lab 02/23/13 1505 02/24/13 0400 02/25/13 0550  NA 126* 128* 134*  K 3.0* 3.0* 2.6*  CL 74* 82* 92*  CO2 28 29 38*  GLUCOSE 514* 416* 155*  BUN 54* 52* 36*  CREATININE 2.17* 1.95* 1.38*  CALCIUM 9.2 7.9* 8.3*  MG 2.3  --   --    Liver Function Tests:  Recent Labs Lab 02/23/13 1505  AST 14  ALT 11  ALKPHOS 105  BILITOT 3.2*  PROT 6.7  ALBUMIN  3.6    Recent Labs Lab 02/23/13 1505  LIPASE 46   No results found for this basename: AMMONIA,  in the last 168 hours CBC:  Recent Labs Lab 02/23/13 1505 02/24/13 0520 02/25/13 0550  WBC 19.3* 12.8* 10.9*  NEUTROABS 15.7*  --   --   HGB 15.8* 12.9 13.1  HCT 43.1 35.5* 35.7*  MCV 80.6 81.1 82.1  PLT 314 217 228   Cardiac Enzymes: No results found for this basename: CKTOTAL, CKMB, CKMBINDEX, TROPONINI,  in the last 168 hours BNP: No components found with this basename: POCBNP,  CBG:  Recent Labs Lab 02/24/13 0709  02/24/13 1710 02/24/13 2159 02/25/13 0725 02/25/13 1210  GLUCAP 376* 339* 210* 159* 211*    Recent Results (from the past 240 hour(s))  URINE CULTURE     Status: None   Collection Time    02/23/13  4:54 PM      Result Value Range Status   Specimen Description URINE, CLEAN CATCH   Final   Special Requests NONE   Final   Culture  Setup Time 02/24/2013 02:07   Final   Colony Count >=100,000 COLONIES/ML   Final   Culture ENTEROCOCCUS SPECIES   Final   Report Status PENDING   Incomplete     Studies: Dg Chest 2 View 02/23/2013    IMPRESSION: Chronic interstitial lung disease changes/fibrosis. No acute abnormalities.   Original Report Authenticated By: Ulyses Southward, M.D.   Ct Head Wo Contrast 02/23/2013    IMPRESSION: No intracranial hemorrhage.  Hypodensity right paracentral medulla and right aspect of the cerebellum may be related to streak artifact although difficult to exclude changes of acute infarct.  Small vessel disease type changes.  Mild global atrophy without hydrocephalus.  Air-fluid level left maxillary sinus raising possibility acute sinusitis.   Original Report Authenticated By: Lacy Duverney, M.D.    Scheduled Meds: . aspirin  325 mg Oral Daily  . cefTRIAXone   2 g Intravenous Q24H  . cloNIDine  0.1 mg Oral BID  . FLUoxetine  40 mg Oral Daily  . insulin aspart protamine-  14 Units Subcutaneous BID WC  . nebivolol  10 mg Oral Daily  . pantoprazole  40 mg Oral Daily  . potassium chloride  40 mEq Oral BID   Continuous Infusions: . sodium chloride 75 mL/hr at 02/24/13 2205

## 2013-02-25 NOTE — Telephone Encounter (Signed)
ATC line busy x 4 wcb 

## 2013-02-25 NOTE — Progress Notes (Signed)
Inpatient Diabetes Program Recommendations  AACE/ADA: New Consensus Statement on Inpatient Glycemic Control (2013)  Target Ranges:  Prepandial:   less than 140 mg/dL      Peak postprandial:   less than 180 mg/dL (1-2 hours)      Critically ill patients:  140 - 180 mg/dL   Reason for Visit: Hyperglycemia  Results for Sarah Kirk, Sarah Kirk (MRN [id number]) as of 02/25/2013 12:52  Ref. Range 02/24/2013 07:09 02/24/2013 17:10 02/24/2013 21:59 02/25/2013 07:25 02/25/2013 12:10  Glucose-Capillary Latest Range: 70-99 mg/dL 409 (H) 811 (H) 914 (H) 159 (H) 211 (H)  Results for Sarah Kirk, Sarah Kirk (MRN [id number]) as of 02/25/2013 12:52  Ref. Range 02/23/2013 15:05 02/24/2013 04:00 02/25/2013 05:50  Glucose Latest Range: 70-99 mg/dL 086 (H) 578 (H) 469 (H)    Inpatient Diabetes Program Recommendations Correction (SSI): Please add Novolog moderate tidwc   Note: Will follow.  Thank you. Ailene Ards, RD, LDN, CDE Inpatient Diabetes Coordinator [phone]

## 2013-02-26 ENCOUNTER — Ambulatory Visit: Payer: Medicare Other | Admitting: Internal Medicine

## 2013-02-26 DIAGNOSIS — N289 Disorder of kidney and ureter, unspecified: Secondary | ICD-10-CM

## 2013-02-26 LAB — CBC
HCT: 34 % — ABNORMAL LOW (ref 36.0–46.0)
Hemoglobin: 12.2 g/dL (ref 12.0–15.0)
RDW: 13.6 % (ref 11.5–15.5)
WBC: 11.4 10*3/uL — ABNORMAL HIGH (ref 4.0–10.5)

## 2013-02-26 LAB — BASIC METABOLIC PANEL
BUN: 24 mg/dL — ABNORMAL HIGH (ref 6–23)
Chloride: 94 mEq/L — ABNORMAL LOW (ref 96–112)
GFR calc Af Amer: 59 mL/min — ABNORMAL LOW (ref >90)
Potassium: 3 mEq/L — ABNORMAL LOW (ref 3.5–5.1)
Sodium: 135 mEq/L (ref 135–145)

## 2013-02-26 LAB — GLUCOSE, CAPILLARY: Glucose-Capillary: 130 mg/dL — ABNORMAL HIGH (ref 70–99)

## 2013-02-26 MED ORDER — NITROFURANTOIN MONOHYD MACRO 100 MG PO CAPS: 100.0000 mg | ORAL_CAPSULE | Freq: Two times a day (BID) | ORAL | Status: DC

## 2013-02-26 MED ORDER — FUROSEMIDE 40 MG PO TABS: 40.0000 mg | ORAL_TABLET | Freq: Every day | ORAL | Status: DC

## 2013-02-26 MED ORDER — POTASSIUM CHLORIDE CRYS ER 20 MEQ PO TBCR
40.0000 meq | EXTENDED_RELEASE_TABLET | Freq: Once | ORAL | Status: AC
  Administered 2013-02-26: 40 meq via ORAL
  Filled 2013-02-26: qty 2

## 2013-02-26 MED ORDER — METOLAZONE 5 MG PO TABS: 2.5000 mg | ORAL_TABLET | ORAL | Status: DC

## 2013-02-26 MED ORDER — HYDROCODONE-ACETAMINOPHEN 5-325 MG PO TABS: 1.0000 | ORAL_TABLET | Freq: Four times a day (QID) | ORAL | Status: DC | PRN

## 2013-02-26 MED ORDER — NEBIVOLOL HCL 10 MG PO TABS: 10.0000 mg | ORAL_TABLET | Freq: Every day | ORAL | Status: DC

## 2013-02-26 NOTE — Progress Notes (Signed)
Patient had no post void residual with the bladder scan.  Philomena Doheny RN

## 2013-02-26 NOTE — Discharge Summary (Signed)
Physician Discharge Summary  Mandalyn Pasqua GEX:[id number] DOB: 04-Jun-1933 DOA: 02/23/2013  PCP: Blane Ohara, MD  Admit date: 02/23/2013 Discharge date: 02/26/2013  Recommendations for Outpatient Follow-up:  1. please take Macrobid 100 mg twice daily for next 10 days on discharge. 2. please note that so will call you from [facility] to schedule an appointment for follow up in regards to prolapsed bladder and cystocele  Discharge Diagnoses:  Principal Problem:   Nausea and vomiting Active Problems:   UTI (urinary tract infection)   ARF (acute renal failure)   Hyponatremia   DM ketoacidosis type II, uncontrolled   Leukocytosis   Hypokalemia   HTN (hypertension)   Depression  Discharge Condition: medically stable for discharge home today  Diet recommendation: as tolerated  History of present illness:  79 year old female with past medical history of dyslipidemia, hypertension, diabetes, chronic respiratory failure on home oxygen. Patient presented to [facility] ED 02/23/2013 with nausea and vomiting.  In ED, patient was found to have UTI and was started on rocephin. CBC revealed leukocytosis of 19.3. BMET revealed sodium of 126, potassium of 3 and creatinine of 2.17. In regards to elevated creatinine there are no previous values for comparison.   Assessment/Plan:   Principal Problem:  *Nausea and vomiting  - Likely secondary to urinary tract infection  - Patient tolerates by mouth intake and nausea and vomiting significantly improved   Active Problems:  UTI (urinary tract infection)  - Urine culture grows enterococcus species  - Start Rocephin and start Macrobid 100 mg twice daily for next 10 days. - Patient has a scheduled appointment in 03/25/2013 with [facility] ARF (acute renal failure)  - Secondary to urinary tract infection as well as Lasix - Creatinine trending down to normal limit - Lasix was 40 mg 3 times daily but we have cut at this dosage down to 40 mg daily  for patient to continue once discharged. Hyponatremia  - Secondary to dehydration  - Resolved with IV fluids DM controlled  - A1c on this admission 5.8 indicating good glycemic control  - Continue NovoLog 70/30 mix 14 units twice a day  Leukocytosis  - Secondary to urinary tract infection  Hypokalemia.  - Secondary to GI losses as well as Lasix - Patient is on low-dose potassium at home - Potassium supplemented prior to discharge and patient was instructed to continue taking her potassium supplements at home. Depression  - Continue Prozac  HTN (hypertension)  - Continue clonidine 0.1 mg by mouth twice a day and Nebivolol 10 mg daily   Code Status: full code  Family Communication: Patient's daughter was updated at the bedside. Disposition Plan: home today  Manson Passey, MD  [facility]  Pager [phone]   Consultants:  None  Procedures:  None  Antibiotics:  Rocephin 02/23/2013 --> 02/26/2013 Macrobid Feb 26 2013 for 10 days on discharge   Discharge Exam: Filed Vitals:   02/26/13 0556  BP: 158/82  Pulse: 72  Temp: 97.6 F (36.4 C)  Resp: 18   Filed Vitals:   02/25/13 1339 02/25/13 2053 02/25/13 2205 02/26/13 0556  BP: 109/62 104/44 121/59 158/82  Pulse: 74 75 70 72  Temp: 98 F (36.7 C) 97.6 F (36.4 C)  97.6 F (36.4 C)  TempSrc:  Oral  Oral  Resp: 18 18  18   Height:      Weight:      SpO2: 99% 100%  100%    General: Pt is alert, follows commands appropriately, not in acute distress Cardiovascular: Regular  rate and rhythm, S1/S2 +, no murmurs, no rubs, no gallops Respiratory: Clear to auscultation bilaterally, no wheezing, no crackles, no rhonchi Abdominal: Soft, non tender, non distended, bowel sounds +, no guarding Extremities: no edema, no cyanosis, pulses palpable bilaterally DP and PT Neuro: Grossly nonfocal  Discharge Instructions  Discharge Orders   Future Orders Complete By Expires     Call MD for:  difficulty breathing, headache or visual  disturbances  As directed     Call MD for:  persistant dizziness or light-headedness  As directed     Call MD for:  persistant nausea and vomiting  As directed     Call MD for:  severe uncontrolled pain  As directed     Diet - low sodium heart healthy  As directed     Discharge instructions  As directed     Comments:      1. please take Macrobid 100 mg twice daily for next 10 days on discharge. 2. please note that so will call you from [facility] to schedule an appointment for follow up in regards to prolapsed bladder and cystocele    Increase activity slowly  As directed         Medication List    TAKE these medications       aspirin 325 MG tablet  Take 325 mg by mouth daily.     cloNIDine 0.1 MG tablet  Commonly known as:  CATAPRES  Take 0.1 mg by mouth 2 (two) times daily.     famotidine 20 MG tablet  Commonly known as:  PEPCID  Take 20 mg by mouth 2 (two) times daily.     FLUoxetine 40 MG capsule  Commonly known as:  PROZAC  Take 40 mg by mouth daily.     furosemide 40 MG tablet  Commonly known as:  LASIX  Take 40 mg by mouth daily.     HUMULIN 70/30 [place]  Inject 14 Units into the skin 2 (two) times daily.     HYDROcodone-acetaminophen 5-325 MG per tablet  Commonly known as:  NORCO  Take 1 tablet by mouth every 6 (six) hours as needed for pain.     ibuprofen 200 MG tablet  Commonly known as:  ADVIL,MOTRIN  Take 600 mg by mouth every 6 (six) hours as needed for pain.     metolazone 5 MG tablet  Commonly known as:  ZAROXOLYN  Take 2.5 mg by mouth every other day.     nebivolol 10 MG tablet  Commonly known as:  BYSTOLIC  Take 10 mg by mouth daily.     nitrofurantoin (macrocrystal-monohydrate) 100 MG capsule  Commonly known as:  MACROBID  Take 1 capsule (100 mg total) by mouth 2 (two) times daily.     omeprazole 20 MG capsule  Commonly known as:  PRILOSEC  Take 20 mg by mouth daily.     potassium chloride SA 20 MEQ tablet  Commonly known as:   K-DUR,KLOR-CON  Take 20 mEq by mouth daily.              Follow-up Information   Follow up with [facility]. (you will be called to schedule the appointment )    Contact information:   [address] [employer] [place] [address] [phone]       The results of significant diagnostics from this hospitalization (including imaging, microbiology, ancillary and laboratory) are listed below for reference.    Significant Diagnostic Studies: Dg Chest 2  View  02/23/2013   *RADIOLOGY REPORT*  Clinical Data: Cough, shortness of breath, nausea, history of CHF  CHEST - 2 VIEW  Comparison: 02/04/2013  Findings: Enlargement of cardiac silhouette. Stable mediastinal contours. Chronic interstitial lung disease changes identified in the lungs bilaterally, greatest in the right upper lobe and left mid lung. Minimal thickening at the minor fissure. No definite acute infiltrate, pleural effusion or pneumothorax. Osseous demineralization. Question calcification versus artifact projecting over the mid left lung.  IMPRESSION: Chronic interstitial lung disease changes/fibrosis. No acute abnormalities.   Original Report Authenticated By: Ulyses Southward, M.D.   Ct Head Wo Contrast  02/23/2013   *RADIOLOGY REPORT*  Clinical Data: Difficulty urinating.  Weakness, shaking and vomiting.  CT HEAD WITHOUT CONTRAST  Technique:  Contiguous axial images were obtained from the base of the skull through the vertex without contrast.  Comparison: None.  Findings: No intracranial hemorrhage.  Hypodensity right paracentral medulla and right aspect of the cerebellum may be related to streak artifact although difficult to exclude changes of acute infarct.  Small vessel disease type changes.  Mild global atrophy without hydrocephalus.  No intracranial mass lesion detected on this unenhanced exam.  Vascular calcifications.  Air-fluid level left maxillary sinus raising possibility acute sinusitis.  Opacification anterior right  ethmoid sinus air cell.  IMPRESSION: No intracranial hemorrhage.  Hypodensity right paracentral medulla and right aspect of the cerebellum may be related to streak artifact although difficult to exclude changes of acute infarct.  Small vessel disease type changes.  Mild global atrophy without hydrocephalus.  Air-fluid level left maxillary sinus raising possibility acute sinusitis.   Original Report Authenticated By: Lacy Duverney, M.D.    Microbiology: Recent Results (from the past 240 hour(s))  URINE CULTURE     Status: None   Collection Time    02/23/13  4:54 PM      Result Value Range Status   Specimen Description URINE, CLEAN CATCH   Final   Special Requests NONE   Final   Culture  Setup Time 02/24/2013 02:07   Final   Colony Count >=100,000 COLONIES/ML   Final   Culture ENTEROCOCCUS SPECIES   Final   Report Status 02/25/2013 FINAL   Final   Organism ID, Bacteria ENTEROCOCCUS SPECIES   Final  URINE CULTURE     Status: None   Collection Time    02/24/13  7:25 AM      Result Value Range Status   Specimen Description URINE, RANDOM   Final   Special Requests NONE   Final   Culture  Setup Time 02/24/2013 11:27   Final   Colony Count 85,000 COLONIES/ML   Final   Culture     Final   Value: Multiple bacterial morphotypes present, none predominant. Suggest appropriate recollection if clinically indicated.   Report Status 02/25/2013 FINAL   Final     Labs: Basic Metabolic Panel:  Recent Labs Lab 02/23/13 1505 02/24/13 0400 02/25/13 0550 02/26/13 0345  NA 126* 128* 134* 135  K 3.0* 3.0* 2.6* 3.0*  CL 74* 82* 92* 94*  CO2 28 29 38* 34*  GLUCOSE 514* 416* 155* 114*  BUN 54* 52* 36* 24*  CREATININE 2.17* 1.95* 1.38* 1.03  CALCIUM 9.2 7.9* 8.3* 8.3*  MG 2.3  --   --   --    Liver Function Tests:  Recent Labs Lab 02/23/13 1505  AST 14  ALT 11  ALKPHOS 105  BILITOT 3.2*  PROT 6.7  ALBUMIN 3.6    Recent  Labs Lab 02/23/13 1505  LIPASE 46   No results found for this  basename: AMMONIA,  in the last 168 hours CBC:  Recent Labs Lab 02/23/13 1505 02/24/13 0520 02/25/13 0550 02/26/13 0345  WBC 19.3* 12.8* 10.9* 11.4*  NEUTROABS 15.7*  --   --   --   HGB 15.8* 12.9 13.1 12.2  HCT 43.1 35.5* 35.7* 34.0*  MCV 80.6 81.1 82.1 81.9  PLT 314 217 228 211   Cardiac Enzymes: No results found for this basename: CKTOTAL, CKMB, CKMBINDEX, TROPONINI,  in the last 168 hours BNP: BNP (last 3 results)  Recent Labs  01/28/13 1618 02/23/13 1505  PROBNP 204.0* 1085.0*   CBG:  Recent Labs Lab 02/25/13 0725 02/25/13 1210 02/25/13 1645 02/25/13 2144 02/26/13 0733  GLUCAP 159* 211* 225* 67* 130*    Time coordinating discharge: Over 30 minutes  Signed:  Manson Passey, MD  [facility]  02/26/2013, 10:47 AM  Pager #: [phone]

## 2013-02-26 NOTE — Evaluation (Signed)
Physical Therapy Evaluation Patient Details Name: Sarah Kirk MRN: [id number] DOB: [birth year] Today's Date: 02/26/2013 Time: 8295-6213 PT Time Calculation (min): 25 min  PT Assessment / Plan / Recommendation Clinical Impression  79 yo female admitted 02/23/13 with N/V , dehydration, UTI. Recently in hospital for UTI. Pt lives with daughter and was independent PTA. Is on home O2.pt will benefit from PT while in acute care with palns to DC home. Rec. HHPT.    PT Assessment  Patient needs continued PT services    Follow Up Recommendations  Home health PT    Does the patient have the potential to tolerate intense rehabilitation      Barriers to Discharge        Equipment Recommendations       Recommendations for Other Services     Frequency Min 3X/week    Precautions / Restrictions Precautions Precaution Comments:         o 2 dep Restrictions Weight Bearing Restrictions: No   Pertinent Vitals/Pain On 4 l sats 90% after mobility.      Mobility  Bed Mobility Bed Mobility: Supine to Sit;Sit to Supine Supine to Sit: 5: Supervision;HOB flat Transfers Transfers: Sit to Stand;Stand to Sit Sit to Stand: 4: Min guard;From bed Stand to Sit: 5: Supervision;With upper extremity assist;To chair/3-in-1 Ambulation/Gait Ambulation/Gait Assistance: 4: Min assist Ambulation Distance (Feet): 15 Feet Assistive device: Rolling walker Ambulation/Gait Assistance Details: incr dyspnea 3-4/4  manages RW well, lines limiting. Gait Pattern: Step-through pattern    Exercises     PT Diagnosis: Generalized weakness  PT Problem List: Decreased strength;Decreased activity tolerance;Decreased mobility;Decreased knowledge of use of DME;Decreased safety awareness;Decreased knowledge of precautions;Cardiopulmonary status limiting activity PT Treatment Interventions: DME instruction;Gait training;Stair training;Functional mobility training;Therapeutic activities;Therapeutic exercise;Patient/family  education   PT Goals Acute Rehab PT Goals PT Goal Formulation: With patient Time For Goal Achievement: 03/12/13 Potential to Achieve Goals: Good Pt will go Sit to Stand: with modified independence PT Goal: Sit to Stand - Progress: Goal set today Pt will go Stand to Sit: with modified independence PT Goal: Stand to Sit - Progress: Goal set today Pt will Ambulate: 51 - 150 feet;with supervision;with rolling walker PT Goal: Ambulate - Progress: Goal set today Pt will Go Up / Down Stairs: 3-5 stairs;with supervision;with rail(s) Pt will Perform Home Exercise Program: Independently PT Goal: Perform Home Exercise Program - Progress: Goal set today  Visit Information  Last PT Received On: 02/26/13 Assistance Needed: +2 (equipment , chair.)    Subjective Data  Subjective: I have been able to get myself to the potty.  Patient Stated Goal: to go home   Prior Functioning  Home Living Lives With: Daughter Available Help at Discharge: Family Type of Home: House Home Access: Stairs to enter Secretary/administrator of Steps: 5 Entrance Stairs-Rails: Can reach both Home Layout: One level Bathroom Shower/Tub: Health visitor: Standard Home Adaptive Equipment: Environmental consultant - rolling Prior Function Level of Independence: Independent Able to Take Stairs?: Yes Driving: No Communication Communication: No difficulties    Cognition  Cognition Arousal/Alertness: Awake/alert Behavior During Therapy: WFL for tasks assessed/performed Overall Cognitive Status: Within Functional Limits for tasks assessed    Extremity/Trunk Assessment Right Upper Extremity Assessment RUE ROM/Strength/Tone: [facility] for tasks assessed Left Upper Extremity Assessment LUE ROM/Strength/Tone: WFL for tasks assessed LUE Sensation: WFL - Light Touch Right Lower Extremity Assessment RLE ROM/Strength/Tone: WFL for tasks assessed Left Lower Extremity Assessment LLE ROM/Strength/Tone: WFL for tasks assessed LLE  Sensation: WFL - Light Touch  Balance    End of Session PT - End of Session Activity Tolerance: Patient limited by fatigue Patient left: with family/visitor present Nurse Communication: Mobility status  GP     Rada Hay 02/26/2013, 8:59 AM  Blanchard Kelch PT [phone]

## 2013-02-26 NOTE — Telephone Encounter (Signed)
LMOMTCB x 1 

## 2013-02-27 NOTE — Telephone Encounter (Signed)
I spoke with the pt daughter and she states that the pt is currently in the hospital and they she has been referred to [facility] and has an appt there. She did ask about a referral to PCP she states they discussed this with TP at last OV. Per pt instructions : Return in 2 weeks for PFT and office visit with [name]  We are referring you to PCP within [place] .  An order was not placed so I have placed the order. Pt daughter is aware that she will be contacted with appt. Carron Curie, CMA

## 2013-03-05 NOTE — Addendum Note (Signed)
Addended by: Boone Master E on: 03/05/2013 11:03 AM   Modules accepted: Orders

## 2013-03-17 ENCOUNTER — Telehealth: Payer: Self-pay | Admitting: *Deleted

## 2013-03-17 NOTE — Telephone Encounter (Signed)
Spoke with pt's daughter and notified of recs re macrobid She verbalized understanding and will inform the pt I have added this drug to her list of intolerances

## 2013-03-17 NOTE — Telephone Encounter (Signed)
Message copied by Christen Butter on Tue Mar 17, 2013 10:09 AM ------      Message from: Nyoka Cowden      Created: Tue Mar 17, 2013  9:01 AM       Notify pt she should not receive macrodantin and list it as an allergy as could well make her lung function worse. ------

## 2013-03-24 ENCOUNTER — Other Ambulatory Visit (INDEPENDENT_AMBULATORY_CARE_PROVIDER_SITE_OTHER): Payer: Medicare Other

## 2013-03-24 ENCOUNTER — Ambulatory Visit (HOSPITAL_COMMUNITY)
Admission: RE | Admit: 2013-03-24 | Discharge: 2013-03-24 | Disposition: A | Discharge status: Home / Self Care | Payer: Medicare Other | Source: Ambulatory Visit | Attending: Internal Medicine | Admitting: Internal Medicine

## 2013-03-24 ENCOUNTER — Ambulatory Visit (INDEPENDENT_AMBULATORY_CARE_PROVIDER_SITE_OTHER): Payer: Medicare Other | Admitting: Internal Medicine

## 2013-03-24 ENCOUNTER — Encounter: Payer: Self-pay | Admitting: Internal Medicine

## 2013-03-24 VITALS — BP 134/84 | HR 80 | Temp 97.6°F | Ht 68.0 in | Wt 244.0 lb

## 2013-03-24 DIAGNOSIS — R059 Cough, unspecified: Secondary | ICD-10-CM | POA: Insufficient documentation

## 2013-03-24 DIAGNOSIS — Z87891 Personal history of nicotine dependence: Secondary | ICD-10-CM | POA: Insufficient documentation

## 2013-03-24 DIAGNOSIS — R0600 Dyspnea, unspecified: Secondary | ICD-10-CM

## 2013-03-24 DIAGNOSIS — J9612 Chronic respiratory failure with hypercapnia: Secondary | ICD-10-CM | POA: Insufficient documentation

## 2013-03-24 DIAGNOSIS — N179 Acute kidney failure, unspecified: Secondary | ICD-10-CM

## 2013-03-24 DIAGNOSIS — J8410 Pulmonary fibrosis, unspecified: Secondary | ICD-10-CM | POA: Insufficient documentation

## 2013-03-24 DIAGNOSIS — I10 Essential (primary) hypertension: Secondary | ICD-10-CM

## 2013-03-24 DIAGNOSIS — R05 Cough: Secondary | ICD-10-CM | POA: Insufficient documentation

## 2013-03-24 DIAGNOSIS — R609 Edema, unspecified: Secondary | ICD-10-CM

## 2013-03-24 DIAGNOSIS — J9610 Chronic respiratory failure, unspecified whether with hypoxia or hypercapnia: Secondary | ICD-10-CM

## 2013-03-24 DIAGNOSIS — R0609 Other forms of dyspnea: Secondary | ICD-10-CM | POA: Insufficient documentation

## 2013-03-24 DIAGNOSIS — R0989 Other specified symptoms and signs involving the circulatory and respiratory systems: Secondary | ICD-10-CM | POA: Insufficient documentation

## 2013-03-24 LAB — BASIC METABOLIC PANEL
Calcium: 8 mg/dL — ABNORMAL LOW (ref 8.4–10.5)
Creatinine, Ser: 1 mg/dL (ref 0.4–1.2)
GFR: 55.76 mL/min — ABNORMAL LOW (ref >60.00)
Sodium: 138 mEq/L (ref 135–145)

## 2013-03-24 MED ORDER — ALBUTEROL SULFATE (5 MG/ML) 0.5% IN NEBU
2.5000 mg | INHALATION_SOLUTION | Freq: Once | RESPIRATORY_TRACT | Status: AC
  Administered 2013-03-24: 2.5 mg via RESPIRATORY_TRACT

## 2013-03-24 NOTE — Progress Notes (Signed)
Subjective:    Patient ID: Sarah Kirk, female    DOB: 1933/03/10 MRN: [id number]    Brief patient profile:  79 yowf quit smoking 1981 at wt < 170-200  eval in 2010 in pulmonary clinic with mostly restrictive changes c/w obesity.   Previous w/u: baseline = last Nov 08 dx with chf transiently better and fine able to walk outside up incline until Feb 09 gradual worsening to point of sob rm to rm no noct awakening just sob if she has to get out of bed to go to the bathroom. neb doesn't help much.  06/10/08 initial eval, copd vs ace, ace d/c'd   July 14, 2008 ov 100% better to her satisfaction after stopped ace, returns for PFT's, no limiiting sob, cough, cp. only finding was disproportionate reduction in diffusing capacity > no change rx  October 19, 2008 ov  Because of obesity I worried about occult thromboembolic disease and obtained a CT scan of her chest which was normal except for elevated right heart structures suggesting chronic pulmonary hypertension. This lead to overnight sleep oximetry showing 25 minutes of desaturation for which I recommended she be placed on oxygen at 2 L but she says she feels better when she sleeps without it.   rec noct 02 titrated to adequate sats Serial echo to follow ? PAH > to see [facility]ology .     12/10/2012 1st pulmonary ov in EPIC era with wt drop from 272  Down to 190  Early in 2013  With no problem  Breathing and no need for 02 then started downhill since Feb 2013 with steady wt gain  Since then fell June 2013    fell broke 2 ribs >  now sob room to room, placed on 02 prn since a year ago. No better on neb. rec bystolic 10 mg twice daily in place of atenolol Only use nebulizer if you feel it really helps you Wear 02 2lpm 24 hours per day for  Now     .01/28/2013 f/u ov/Sarah Kirk f/u sob ? Etiology Chief Complaint  Patient presents with  . Acute Visit    Breathing worse for the past 2 wks, gets out of breath with any exertion at all, such as  standing to weigh on the scale today.   sleeps on side on 02 ok horizontal and no increase in chronic leg swelling or cp of any kind. Sob is moderate and comes on just with exertion, not at rest, not clear using 02 as directed - not clear whether may have received macrodantin recently for uti (daughter thinks so but unclear on specifics) bystolic 10 mg daily instead of tenormin until you come back Try nebulizer when your breathing is bad at rest or you know a certain  activity is going to make you short of breath to see if it helps.  Try prilosec 20mg   Take 30-60 min before first meal of the day and Pepcid 20 mg one bedtime  GERD diet     02/12/13 Follow up and med calendar  Returns for follow up and med review  We reviewed all her meds and organized them into a med calendar with pt education .  Finished prednisone yesterday.  Hospital records reviewed from [place].  Recently admitted to [facility] for new onset Atrial fib w/ CHF. COPD flare  Atrial fib was tx w /rate control , no anticoagulation.  CT chest showed mild chronic interstitial lung disease ? NSIP  She was tx w/ IV steroids  and diureis. No discharge summary avail  She is feeling better but still weak.  Denies chest pain, orthopnea, syncope or fever.  Wt is down 13 lbs , edema is less rec Follow med calendar closely and bring to each visit.  Return in 2 weeks for PFT and office visit with [name]   We are referring you to PCP within [place] .   02/23/13 Admit to [facility] with dehydration correlating with complete resolution of peripheral edema   03/24/2013 f/u ov/Sarah Kirk 02 24/7 2 at rest and 4 lpm x walkng Chief Complaint  Patient presents with  . Follow-up    Breathing is unchanged since the last visit   sob if not wearing 02 or if walking s increasing to 4lpm.    No obvious daytime variabilty or assoc chronic cough or cp or chest tightness, subjective wheeze overt sinus or hb symptoms. No unusual exp hx or h/o childhood pna/  asthma or premature birth to her knowledge.   Sleeping ok without nocturnal  or early am exacerbation  of respiratory  c/o's or need for noct saba. Also denies any obvious fluctuation of symptoms with weather or environmental changes or other aggravating or alleviating factors except as outlined above   Current Medications, Allergies, Past Medical History, Past Surgical History, Family History, and Social History were reviewed in [employer] record.  ROS  The following are not active complaints unless bolded sore throat, dysphagia, dental problems, itching, sneezing,  nasal congestion or excess/ purulent secretions, ear ache,   fever, chills, sweats, unintended wt loss, pleuritic or exertional cp, hemoptysis,  orthopnea pnd or leg swelling, presyncope, palpitations, heartburn, abdominal pain, anorexia, nausea, vomiting, diarrhea  or change in bowel or urinary habits, change in stools or urine, dysuria,hematuria,  rash, arthralgias, visual complaints, headache, numbness weakness or ataxia or problems with walking or coordination,  change in mood/affect or memory.          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 Nl external ear canals without cough  reflex   NECK :  without JVD/Nodes/TM/ nl carotid upstrokes bilaterally   LUNGS: no acc muscle use, clear to A and P bilaterally without cough on insp or exp maneuvers   CV: irreg  no s3 or murmur or increase in P2,  Tr-1+bil lower ext edema   ABD:  soft and nontender with limited excursion with inspiration. No bruits or organomegaly, bowel sounds nl  MS:  warm without deformities, calf tenderness, cyanosis or clubbing  SKIN: warm and dry without lesions    NEURO:  alert, approp, no deficits   CT chest 02/06/13  Suspected  mild chronic interstitial lung disease, possibly>nonspecific interstitial pneumonitis (NSIP), progressed from 2011. Small mediastinal lymph nodes, as described above, likely reactive No evidence of acute cardiopulmonary disease.        Assessment & Plan:

## 2013-03-24 NOTE — Patient Instructions (Addendum)
02 2lpm at rest and 4lpm with activity  Wear elastic stockings (TEDS) and elevate legs as much as possible   Ok to hold furosemide completely and the metazolone if leg swelling is completely gone  For now take furosemide one daily and metazolone one every other day  For excess swelling take one extra furosemide daily   See Batey next and let her manage your diuretics   Please remember to go to the lab  department downstairs for your tests - we will call you with the results when they are available.    Please schedule a follow up visit in 3 months but call sooner if needed

## 2013-03-24 NOTE — Assessment & Plan Note (Signed)
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  rx is supportive / 02 / no role for steroids at this point

## 2013-03-24 NOTE — Assessment & Plan Note (Signed)
rx 02 2lpm at rest and 4lpm with exertion.  No change in rx

## 2013-03-24 NOTE — Assessment & Plan Note (Signed)
Adequate control on present rx, reviewed  

## 2013-03-24 NOTE — Assessment & Plan Note (Addendum)
See echo 02/24/13 Left ventricle: The cavity size was normal. There was moderate concentric hypertrophy. Systolic function was normal. The estimated ejection fraction was in the range of 55% to 60%. Although no diagnostic regional wall motion abnormality was identified, this possibility cannot be completely excluded on the basis of this study. The study is not technically sufficient to allow evaluation of LV diastolic function. - Aortic valve: Trivial regurgitation. - Left atrium: The atrium was severely dilated. - Right atrium: The atrium was severely dilated. - Pulmonary arteries: PA peak pressure: 32mm Hg (S).   Probably has element of diast chf/cor pulmonale and venous insufficiency from obesity  Will need to adjust diuretics to tolerable level of edema but if we eliminate it there is a risk of precipitating renal failure.      Each maintenance medication was reviewed in detail including most importantly the difference between maintenance and as needed and under what circumstances the prns are to be used. This was done in the context of a medication calendar review which provided the patient with a user-friendly unambiguous mechanism for medication administration and reconciliation and provides an action plan for all active problems. It is critical that this be shown to every doctor  for modification during the office visit if necessary so the patient can use it as a working document.

## 2013-03-25 ENCOUNTER — Other Ambulatory Visit: Payer: Self-pay | Admitting: Internal Medicine

## 2013-03-25 MED ORDER — POTASSIUM CHLORIDE CRYS ER 20 MEQ PO TBCR: 40.0000 meq | EXTENDED_RELEASE_TABLET | Freq: Every day | ORAL | Status: DC

## 2013-03-25 NOTE — Progress Notes (Signed)
Quick Note:  dtr notified of results/recs Rx mailed to her per her req ______

## 2013-04-08 ENCOUNTER — Ambulatory Visit (INDEPENDENT_AMBULATORY_CARE_PROVIDER_SITE_OTHER): Payer: Medicare Other | Admitting: Internal Medicine

## 2013-04-08 ENCOUNTER — Encounter: Payer: Self-pay | Admitting: Internal Medicine

## 2013-04-08 VITALS — BP 138/76 | HR 90 | Temp 97.4°F | Ht 68.0 in | Wt 242.0 lb

## 2013-04-08 DIAGNOSIS — I10 Essential (primary) hypertension: Secondary | ICD-10-CM

## 2013-04-08 DIAGNOSIS — E1110 Type 2 diabetes mellitus with ketoacidosis without coma: Secondary | ICD-10-CM

## 2013-04-08 DIAGNOSIS — E1310 Other specified diabetes mellitus with ketoacidosis without coma: Secondary | ICD-10-CM

## 2013-04-08 DIAGNOSIS — F329 Major depressive disorder, single episode, unspecified: Secondary | ICD-10-CM

## 2013-04-08 DIAGNOSIS — Z23 Encounter for immunization: Secondary | ICD-10-CM

## 2013-04-08 DIAGNOSIS — R609 Edema, unspecified: Secondary | ICD-10-CM

## 2013-04-08 NOTE — Patient Instructions (Signed)
Type 2 Diabetes Mellitus, Adult Type 2 diabetes mellitus, often simply referred to as type 2 diabetes, is a long-lasting (chronic) disease. In type 2 diabetes, the pancreas does not make enough insulin (a hormone), the cells are less responsive to the insulin that is made (insulin resistance), or both. Normally, insulin moves sugars from food into the tissue cells. The tissue cells use the sugars for energy. The lack of insulin or the lack of normal response to insulin causes excess sugars to build up in the blood instead of going into the tissue cells. As a result, high blood sugar (hyperglycemia) develops. The effect of high sugar (glucose) levels can cause many complications. Type 2 diabetes was also previously called adult-onset diabetes but it can occur at any age.  RISK FACTORS  A person is predisposed to developing type 2 diabetes if someone in the family has the disease and also has one or more of the following primary risk factors:  Overweight.  An inactive lifestyle.  A history of consistently eating high-calorie foods. Maintaining a normal weight and regular physical activity can reduce the chance of developing type 2 diabetes. SYMPTOMS  A person with type 2 diabetes may not show symptoms initially. The symptoms of type 2 diabetes appear slowly. The symptoms include:  Increased thirst (polydipsia).  Increased urination (polyuria).  Increased urination during the night (nocturia).  Weight loss. This weight loss may be rapid.  Frequent, recurring infections.  Tiredness (fatigue).  Weakness.  Vision changes, such as blurred vision.  Fruity smell to your breath.  Abdominal pain.  Nausea or vomiting.  Cuts or bruises which are slow to heal.  Tingling or numbness in the hands or feet. DIAGNOSIS Type 2 diabetes is frequently not diagnosed until complications of diabetes are present. Type 2 diabetes is diagnosed when symptoms or complications are present and when blood  glucose levels are increased. Your blood glucose level may be checked by one or more of the following blood tests:  A fasting blood glucose test. You will not be allowed to eat for at least 8 hours before a blood sample is taken.  A random blood glucose test. Your blood glucose is checked at any time of the day regardless of when you ate.  A hemoglobin A1c blood glucose test. A hemoglobin A1c test provides information about blood glucose control over the previous 3 months.  An oral glucose tolerance test (OGTT). Your blood glucose is measured after you have not eaten (fasted) for 2 hours and then after you drink a glucose-containing beverage. TREATMENT   You may need to take insulin or diabetes medicine daily to keep blood glucose levels in the desired range.  You will need to match insulin dosing with exercise and healthy food choices. The treatment goal is to maintain the before meal blood sugar (preprandial glucose) level at 70 130 mg/dL. HOME CARE INSTRUCTIONS   Have your hemoglobin A1c level checked twice a year.  Perform daily blood glucose monitoring as directed by your caregiver.  Monitor urine ketones when you are ill and as directed by your caregiver.  Take your diabetes medicine or insulin as directed by your caregiver to maintain your blood glucose levels in the desired range.  Never run out of diabetes medicine or insulin. It is needed every day.  Adjust insulin based on your intake of carbohydrates. Carbohydrates can raise blood glucose levels but need to be included in your diet. Carbohydrates provide vitamins, minerals, and fiber which are an essential part of   a healthy diet. Carbohydrates are found in fruits, vegetables, whole grains, dairy products, legumes, and foods containing added sugars.    Eat healthy foods. Alternate 3 meals with 3 snacks.  Lose weight if overweight.  Carry a medical alert card or wear your medical alert jewelry.  Carry a 15 gram  carbohydrate snack with you at all times to treat low blood glucose (hypoglycemia). Some examples of 15 gram carbohydrate snacks include:  Glucose tablets, 3 or 4   Glucose gel, 15 gram tube  Raisins, 2 tablespoons (24 grams)  Jelly beans, 6  Animal crackers, 8  Regular pop, 4 ounces (120 mL)  Gummy treats, 9  Recognize hypoglycemia. Hypoglycemia occurs with blood glucose levels of 70 mg/dL and below. The risk for hypoglycemia increases when fasting or skipping meals, during or after intense exercise, and during sleep. Hypoglycemia symptoms can include:  Tremors or shakes.  Decreased ability to concentrate.  Sweating.  Increased heart rate.  Headache.  Dry mouth.  Hunger.  Irritability.  Anxiety.  Restless sleep.  Altered speech or coordination.  Confusion.  Treat hypoglycemia promptly. If you are alert and able to safely swallow, follow the 15:15 rule:  Take 15 20 grams of rapid-acting glucose or carbohydrate. Rapid-acting options include glucose gel, glucose tablets, or 4 ounces (120 mL) of fruit juice, regular soda, or low fat milk.  Check your blood glucose level 15 minutes after taking the glucose.  Take 15 20 grams more of glucose if the repeat blood glucose level is still 70 mg/dL or below.  Eat a meal or snack within 1 hour once blood glucose levels return to normal.    Be alert to polyuria and polydipsia which are early signs of hyperglycemia. An early awareness of hyperglycemia allows for prompt treatment. Treat hyperglycemia as directed by your caregiver.  Engage in at least 150 minutes of moderate-intensity physical activity a week, spread over at least 3 days of the week or as directed by your caregiver. In addition, you should engage in resistance exercise at least 2 times a week or as directed by your caregiver.  Adjust your medicine and food intake as needed if you start a new exercise or sport.  Follow your sick day plan at any time you  are unable to eat or drink as usual.  Avoid tobacco use.  Limit alcohol intake to no more than 1 drink per day for nonpregnant women and 2 drinks per day for men. You should drink alcohol only when you are also eating food. Talk with your caregiver whether alcohol is safe for you. Tell your caregiver if you drink alcohol several times a week.  Follow up with your caregiver regularly.  Schedule an eye exam soon after the diagnosis of type 2 diabetes and then annually.  Perform daily skin and foot care. Examine your skin and feet daily for cuts, bruises, redness, nail problems, bleeding, blisters, or sores. A foot exam by a caregiver should be done annually.  Brush your teeth and gums at least twice a day and floss at least once a day. Follow up with your dentist regularly.  Share your diabetes management plan with your workplace or school.  Stay up-to-date with immunizations.  Learn to manage stress.  Obtain ongoing diabetes education and support as needed.  Participate in, or seek rehabilitation as needed to maintain or improve independence and quality of life. Request a physical or occupational therapy referral if you are having foot or hand numbness or difficulties with grooming,   dressing, eating, or physical activity. SEEK MEDICAL CARE IF:   You are unable to eat food or drink fluids for more than 6 hours.  You have nausea and vomiting for more than 6 hours.  Your blood glucose level is over 240 mg/dL.  There is a change in mental status.  You develop an additional serious illness.  You have diarrhea for more than 6 hours.  You have been sick or have had a fever for a couple of days and are not getting better.  You have pain during any physical activity.  SEEK IMMEDIATE MEDICAL CARE IF:  You have difficulty breathing.  You have moderate to large ketone levels. MAKE SURE YOU:  Understand these instructions.  Will watch your condition.  Will get help right away if  you are not doing well or get worse. Document Released: 10/01/2005 Document Revised: 06/25/2012 Document Reviewed: 04/29/2012 [employer] Patient Information 2014 [employer].  

## 2013-04-08 NOTE — Assessment & Plan Note (Signed)
Last A1C reviewed 5.8 Continue current insulin regime Blood sugars reviewed Will recheck A1C in 3 months Consider adding long acting insulin

## 2013-04-08 NOTE — Assessment & Plan Note (Signed)
Well controlled on Prozac Remember to take it every day and don't abruptly stop it Consider CBT

## 2013-04-08 NOTE — Progress Notes (Signed)
HPI  Pt presents to the clinic today to establish care. She is transferring care from Dr. Greer Pickerel in [place], [place]. She has had 2 recent hospitilizations since April 2014. These related to overuse of diuretics. She does not have any concerns today. She does follow with [name] for her CHF  Flu: 06/2012 Pneumovax: 02/2011 Zostavax: never had chicken pox Tetanus: 1990 Eye doctor: scheduled 04/30/2013 Dentist: as needed Foot exam: 2013- [facility] Pap: 2013 (no longer screening) Mammogram: 2010 Colonoscopy: never   Past Medical History  Diagnosis Date  . HBP (high blood pressure)   . CHF (congestive heart failure)   . HLD (hyperlipidemia)   . Morbid obesity     PFTs 07/14/08 moderate restriction vital capacity 58% with ERV 32% DLCO 38, corrects to 108  . Right heart enlargement     CT 07/16/08. nocturnal desat x 25 min 07/29/08  . Atrial fibrillation   . Diabetes mellitus without complication   . PONV (postoperative nausea and vomiting)   . Depression     Current Outpatient Prescriptions  Medication Sig Dispense Refill  . aspirin 325 MG tablet Take 325 mg by mouth every morning.       . cloNIDine (CATAPRES) 0.1 MG tablet Take 0.1 mg by mouth 2 (two) times daily.      Marland Kitchen dextromethorphan-guaiFENesin (MUCINEX DM) 30-600 MG per 12 hr tablet Take 1-2 tablets by mouth every 12 (twelve) hours as needed.      . famotidine (PEPCID) 20 MG tablet Take 20 mg by mouth at bedtime.       Marland Kitchen FLUoxetine (PROZAC) 40 MG capsule Take 40 mg by mouth every morning.       . furosemide (LASIX) 40 MG tablet Take 1 tablet (40 mg total) by mouth daily.  30 tablet  0  . HYDROcodone-acetaminophen (NORCO) 5-325 MG per tablet Take 1 tablet by mouth every 6 (six) hours as needed for pain.  45 tablet  0  . Insulin Isophane & Regular (HUMULIN 70/30 [place]) Inject 14 Units into the skin 2 (two) times daily.      Marland Kitchen ipratropium-albuterol (DUONEB) 0.5-2.5 (3) MG/3ML SOLN Take 3 mLs by nebulization every 6 (six) hours as  needed (wheezing/shortness of breath).      . metolazone (ZAROXOLYN) 5 MG tablet Take 0.5 tablets (2.5 mg total) by mouth every other day.  30 tablet  0  . nebivolol (BYSTOLIC) 10 MG tablet Take 1 tablet (10 mg total) by mouth daily.  30 tablet  3  . omeprazole (PRILOSEC) 20 MG capsule Take 20 mg by mouth daily before breakfast.       . potassium chloride SA (K-DUR,KLOR-CON) 20 MEQ tablet Take 20 mEq by mouth daily.      . potassium chloride SA (K-DUR,KLOR-CON) 20 MEQ tablet Take 2 tablets (40 mEq total) by mouth daily.  60 tablet  3   No current facility-administered medications for this visit.    Allergies  Allergen Reactions  . Tetracyclines & Related Other (See Comments)    Breaks her mouth out  . Macrobid (Nitrofurantoin)     Worsening lung fx  . Metformin     Doctor doesn't want pt to take because it will scar her lungs    Family History  Problem Relation Age of Onset  . Heart disease Father   . Depression Father   . Heart disease Mother     also rheumatism  . Depression Mother   . Stroke Mother   . Heart  disease Brother     sibilings  . Cancer Maternal Aunt   . Ovarian cancer Daughter   . Breast cancer Daughter     History   Social History  . Marital Status: Widowed    Spouse Name: N/A    Number of Children: 2  . Years of Education: 14   Occupational History  . Retired    Social History Main Topics  . Smoking status: Former Smoker -- 1.00 packs/day for 20 years    Types: Cigarettes    Quit date: 10/16/1979  . Smokeless tobacco: Never Used  . Alcohol Use: No  . Drug Use: No  . Sexually Active: Not Currently   Other Topics Concern  . Not on file   Social History Narrative   Lives with daughter, Andrey Campanile, and brother; horse, dog, 15 cats.    Retired Conservation officer, nature.    Regular exercise-no   Caffeine Use-yes    ROS:  Constitutional: Pt reports fatigue. Denies fever, malaise, headache or abrupt weight changes.  HEENT: Denies eye pain, eye redness, ear pain,  ringing in the ears, wax buildup, runny nose, nasal congestion, bloody nose, or sore throat. Respiratory: Pt reports shortness of breath. Denies difficulty breathing, cough or sputum production.   Cardiovascular: Pt reports swelling of the feet. Denies chest pain, chest tightness, palpitations or swelling in the hands.  Gastrointestinal: Pt reports intermittent constipation. Denies abdominal pain, bloating, diarrhea or blood in the stool.  GU: Denies frequency, urgency, pain with urination, blood in urine, odor or discharge. Musculoskeletal: Pt reports multiple joint pains. Denies decrease in range of motion, difficulty with gait, muscle pain or joint swelling.  Skin: Denies redness, rashes, lesions or ulcercations.  Neurological: Denies dizziness, difficulty with memory, difficulty with speech or problems with balance and coordination.   No other specific complaints in a complete review of systems (except as listed in HPI above).  PE:  BP 138/76  Pulse 90  Temp(Src) 97.4 F (36.3 C) (Oral)  Ht 5\' 8"  (1.727 m)  Wt 242 lb (109.77 kg)  BMI 36.8 kg/m2  SpO2 88% Wt Readings from Last 3 Encounters:  04/08/13 242 lb (109.77 kg)  03/24/13 244 lb (110.678 kg)  02/23/13 233 lb 4 oz (105.8 kg)    General: Appears her stated age, obese, chronically ill appearing,  in NAD. HEENT: Head: normal shape and size; Eyes: sclera white, no icterus, conjunctiva pink, PERRLA and EOMs intact; Ears: Tm's gray and intact, normal light reflex; Nose: mucosa pink and moist, septum midline; Throat/Mouth: Teeth present, mucosa pink and moist, no lesions or ulcerations noted.  Neck: Normal range of motion. Neck supple, trachea midline. No massses, lumps or thyromegaly present.  Cardiovascular: Normal rate and rhythm. S1,S2 noted.  No murmur, rubs or gallops noted. No JVD or BLE edema. No carotid bruits noted. Pulmonary/Chest: Normal effort and positive vesicular breath sounds. No respiratory distress. No wheezes,  rales or ronchi noted.  Abdomen: Soft and nontender. Normal bowel sounds, no bruits noted. No distention or masses noted. Liver, spleen and kidneys non palpable. Musculoskeletal: Normal range of motion. No signs of joint swelling. No difficulty with gait.  Neurological: Alert and oriented. Cranial nerves II-XII intact. Coordination normal. +DTRs bilaterally. Psychiatric: Mood and affect normal. Behavior is normal. Judgment and thought content normal.    BMET    Component Value Date/Time   NA 138 03/24/2013 1638   K 3.7 03/24/2013 1638   CL 94* 03/24/2013 1638   CO2 37* 03/24/2013 1638   GLUCOSE  101* 03/24/2013 1638   BUN 11 03/24/2013 1638   CREATININE 1.0 03/24/2013 1638   CALCIUM 8.0* 03/24/2013 1638   GFRNONAA 51* 02/26/2013 0345   GFRAA 59* 02/26/2013 0345    Lipid Panel  No results found for this basename: chol, trig, hdl, cholhdl, vldl, ldlcalc    CBC    Component Value Date/Time   WBC 11.4* 02/26/2013 0345   RBC 4.15 02/26/2013 0345   HGB 12.2 02/26/2013 0345   HCT 34.0* 02/26/2013 0345   PLT 211 02/26/2013 0345   MCV 81.9 02/26/2013 0345   MCH 29.4 02/26/2013 0345   MCHC 35.9 02/26/2013 0345   RDW 13.6 02/26/2013 0345   LYMPHSABS 1.7 02/23/2013 1505   MONOABS 1.9* 02/23/2013 1505   EOSABS 0.0 02/23/2013 1505   BASOSABS 0.0 02/23/2013 1505    Hgb A1C Lab Results  Component Value Date   HGBA1C 5.8* 02/23/2013     Assessment and Plan:  Health Maintenance:  Will give tdap today Pt declines colo screening Have your mammogram done this year No need for Zostavax  Most recent hospitalizations reviewed in EMR as well as labs/imaging and follow ups with [name]. Approx time for review 20 minutes.

## 2013-04-08 NOTE — Assessment & Plan Note (Signed)
Continue compression hose and elevate legs Continue Lasix

## 2013-04-08 NOTE — Assessment & Plan Note (Signed)
Well controlled on current therapy Continue to work on diet and exercise

## 2013-04-10 ENCOUNTER — Other Ambulatory Visit: Payer: Self-pay | Admitting: Internal Medicine

## 2013-04-10 MED ORDER — NEBIVOLOL HCL 10 MG PO TABS: 10.0000 mg | ORAL_TABLET | Freq: Every day | ORAL | Status: DC

## 2013-05-05 ENCOUNTER — Telehealth: Payer: Self-pay | Admitting: *Deleted

## 2013-05-05 ENCOUNTER — Encounter: Payer: Self-pay | Admitting: *Deleted

## 2013-05-05 NOTE — Telephone Encounter (Signed)
Received Bystolic patient assistant called pt spoke with daughter inform her meds are ready for pick-up...lmb

## 2013-05-07 ENCOUNTER — Encounter: Payer: Self-pay | Admitting: *Deleted

## 2013-05-18 ENCOUNTER — Telehealth: Payer: Self-pay | Admitting: *Deleted

## 2013-05-18 ENCOUNTER — Other Ambulatory Visit: Payer: Self-pay | Admitting: *Deleted

## 2013-05-18 MED ORDER — INSULIN NPH ISOPHANE & REGULAR (70-30) 100 UNIT/ML ~~LOC~~ SUSP: 14.0000 [IU] | Freq: Two times a day (BID) | SUBCUTANEOUS | Status: DC

## 2013-05-18 MED ORDER — METOLAZONE 5 MG PO TABS: 2.5000 mg | ORAL_TABLET | ORAL | Status: DC

## 2013-05-18 NOTE — Telephone Encounter (Signed)
Spoke with [employer] not needed.

## 2013-05-18 NOTE — Telephone Encounter (Signed)
Pt called requesting refill on Humulin 70/30 but I am unable to refill it.  Please refill and print.  Daughter is going to pick up tomorrow.  Thank you

## 2013-07-09 ENCOUNTER — Encounter: Payer: Self-pay | Admitting: Internal Medicine

## 2013-07-09 ENCOUNTER — Other Ambulatory Visit (INDEPENDENT_AMBULATORY_CARE_PROVIDER_SITE_OTHER): Payer: Medicare Other

## 2013-07-09 ENCOUNTER — Ambulatory Visit (INDEPENDENT_AMBULATORY_CARE_PROVIDER_SITE_OTHER): Payer: Medicare Other | Admitting: Internal Medicine

## 2013-07-09 ENCOUNTER — Ambulatory Visit: Payer: Medicare Other

## 2013-07-09 VITALS — BP 130/84 | HR 64 | Temp 97.8°F | Ht 68.0 in | Wt 239.0 lb

## 2013-07-09 VITALS — BP 128/84 | HR 82 | Temp 97.6°F | Wt 239.0 lb

## 2013-07-09 DIAGNOSIS — I10 Essential (primary) hypertension: Secondary | ICD-10-CM

## 2013-07-09 DIAGNOSIS — Z23 Encounter for immunization: Secondary | ICD-10-CM

## 2013-07-09 DIAGNOSIS — E119 Type 2 diabetes mellitus without complications: Secondary | ICD-10-CM

## 2013-07-09 DIAGNOSIS — J8410 Pulmonary fibrosis, unspecified: Secondary | ICD-10-CM

## 2013-07-09 DIAGNOSIS — E785 Hyperlipidemia, unspecified: Secondary | ICD-10-CM

## 2013-07-09 DIAGNOSIS — I5042 Chronic combined systolic (congestive) and diastolic (congestive) heart failure: Secondary | ICD-10-CM

## 2013-07-09 DIAGNOSIS — R0609 Other forms of dyspnea: Secondary | ICD-10-CM

## 2013-07-09 DIAGNOSIS — F411 Generalized anxiety disorder: Secondary | ICD-10-CM

## 2013-07-09 DIAGNOSIS — F329 Major depressive disorder, single episode, unspecified: Secondary | ICD-10-CM

## 2013-07-09 DIAGNOSIS — J9610 Chronic respiratory failure, unspecified whether with hypoxia or hypercapnia: Secondary | ICD-10-CM

## 2013-07-09 DIAGNOSIS — I509 Heart failure, unspecified: Secondary | ICD-10-CM | POA: Insufficient documentation

## 2013-07-09 DIAGNOSIS — R0600 Dyspnea, unspecified: Secondary | ICD-10-CM

## 2013-07-09 LAB — CBC
HCT: 36.9 % (ref 36.0–46.0)
Hemoglobin: 12.9 g/dL (ref 12.0–15.0)
MCV: 84.2 fl (ref 78.0–100.0)
Platelets: 281 10*3/uL (ref 150.0–400.0)
RBC: 4.38 Mil/uL (ref 3.87–5.11)
WBC: 11.1 10*3/uL — ABNORMAL HIGH (ref 4.5–10.5)

## 2013-07-09 LAB — COMPREHENSIVE METABOLIC PANEL
BUN: 10 mg/dL (ref 6–23)
CO2: 34 mEq/L — ABNORMAL HIGH (ref 19–32)
GFR: 61.23 mL/min (ref >60.00)
Glucose, Bld: 103 mg/dL — ABNORMAL HIGH (ref 70–99)
Sodium: 138 mEq/L (ref 135–145)
Total Bilirubin: 1.2 mg/dL (ref 0.3–1.2)
Total Protein: 6.7 g/dL (ref 6.0–8.3)

## 2013-07-09 LAB — MICROALBUMIN / CREATININE URINE RATIO
Creatinine,U: 22.9 mg/dL
Microalb Creat Ratio: 1.3 mg/g (ref 0.0–30.0)

## 2013-07-09 LAB — HEMOGLOBIN A1C: Mean Plasma Glucose: 100 mg/dL (ref <117)

## 2013-07-09 LAB — LIPID PANEL
Cholesterol: 186 mg/dL (ref 0–200)
Triglycerides: 102 mg/dL (ref 0.0–149.0)
VLDL: 20.4 mg/dL (ref 0.0–40.0)

## 2013-07-09 MED ORDER — FUROSEMIDE 40 MG PO TABS: 40.0000 mg | ORAL_TABLET | Freq: Every day | ORAL | Status: DC

## 2013-07-09 MED ORDER — POTASSIUM CHLORIDE CRYS ER 20 MEQ PO TBCR: 40.0000 meq | EXTENDED_RELEASE_TABLET | Freq: Every day | ORAL | Status: DC

## 2013-07-09 MED ORDER — INSULIN NPH ISOPHANE & REGULAR (70-30) 100 UNIT/ML ~~LOC~~ SUSP: 14.0000 [IU] | Freq: Two times a day (BID) | SUBCUTANEOUS | Status: DC

## 2013-07-09 MED ORDER — ALPRAZOLAM 0.25 MG PO TABS: 0.2500 mg | ORAL_TABLET | Freq: Two times a day (BID) | ORAL | Status: DC | PRN

## 2013-07-09 NOTE — Addendum Note (Signed)
Addended by: Lorre Munroe on: 07/09/2013 10:52 AM   Modules accepted: Level of Service

## 2013-07-09 NOTE — Assessment & Plan Note (Signed)
On prozac but with worsening anxiety Will add Xanax 0.25 mg BID  Follow up in 1 month to see if prozac needs to be increased

## 2013-07-09 NOTE — Assessment & Plan Note (Signed)
On lasix, metalozone and potassium supplement Weight down 3 pounds today Pt reports worsening shortness of breath ? Anxiety Has appt with [name] today
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
## 2013-07-09 NOTE — Assessment & Plan Note (Signed)
Last A1C 5.8% Foot exam today Will recheck A1C and microalbumin today Flu shot given today Insulin refilled today

## 2013-07-09 NOTE — Assessment & Plan Note (Signed)
Continue to follow with [name]

## 2013-07-09 NOTE — Patient Instructions (Addendum)
Hypertension As your heart beats, it forces blood through your arteries. This force is your blood pressure. If the pressure is too high, it is called hypertension (HTN) or high blood pressure. HTN is dangerous because you may have it and not know it. High blood pressure may mean that your heart has to work harder to pump blood. Your arteries may be narrow or stiff. The extra work puts you at risk for heart disease, stroke, and other problems.  Blood pressure consists of two numbers, a higher number over a lower, 110/72, for example. It is stated as "110 over 72." The ideal is below 120 for the top number (systolic) and under 80 for the bottom (diastolic). Write down your blood pressure today. You should pay close attention to your blood pressure if you have certain conditions such as:  Heart failure.  Prior heart attack.  Diabetes  Chronic kidney disease.  Prior stroke.  Multiple risk factors for heart disease. To see if you have HTN, your blood pressure should be measured while you are seated with your arm held at the level of the heart. It should be measured at least twice. A one-time elevated blood pressure reading (especially in the Emergency Department) does not mean that you need treatment. There may be conditions in which the blood pressure is different between your right and left arms. It is important to see your caregiver soon for a recheck. Most people have essential hypertension which means that there is not a specific cause. This type of high blood pressure may be lowered by changing lifestyle factors such as:  Stress.  Smoking.  Lack of exercise.  Excessive weight.  Drug/tobacco/alcohol use.  Eating less salt. Most people do not have symptoms from high blood pressure until it has caused damage to the body. Effective treatment can often prevent, delay or reduce that damage. TREATMENT  When a cause has been identified, treatment for high blood pressure is directed at the  cause. There are a large number of medications to treat HTN. These fall into several categories, and your caregiver will help you select the medicines that are best for you. Medications may have side effects. You should review side effects with your caregiver. If your blood pressure stays high after you have made lifestyle changes or started on medicines,   Your medication(s) may need to be changed.  Other problems may need to be addressed.  Be certain you understand your prescriptions, and know how and when to take your medicine.  Be sure to follow up with your caregiver within the time frame advised (usually within two weeks) to have your blood pressure rechecked and to review your medications.  If you are taking more than one medicine to lower your blood pressure, make sure you know how and at what times they should be taken. Taking two medicines at the same time can result in blood pressure that is too low. SEEK IMMEDIATE MEDICAL CARE IF:  You develop a severe headache, blurred or changing vision, or confusion.  You have unusual weakness or numbness, or a faint feeling.  You have severe chest or abdominal pain, vomiting, or breathing problems. MAKE SURE YOU:   Understand these instructions.  Will watch your condition.  Will get help right away if you are not doing well or get worse. Document Released: 10/01/2005 Document Revised: 12/24/2011 Document Reviewed: 05/21/2008 [facility] Patient Information 2014 [place]. Diabetes and Standards of Medical Care  Diabetes is complicated. You may find that your diabetes  team includes a dietitian, nurse, diabetes educator, eye doctor, and more. To help everyone know what is going on and to help you get the care you deserve, the following schedule of care was developed to help keep you on track. Below are the tests, exams, vaccines, medicines, education, and plans you will need. A1c test  Performed at least 2 times a year if you are  meeting treatment goals.  Performed 4 times a year if therapy has changed or if you are not meeting treatment goals. Blood pressure test  Performed at every routine medical visit. The goal is less than 120/80 mmHg. Dental exam  Follow up with the dentist regularly. Eye exam  Diagnosed with type 1 diabetes as a child: Get an exam upon reaching the age of 10 years or older and having had diabetes for 3 5 years. Yearly eye exams are recommended after that initial eye exam.  Diagnosed with type 1 diabetes as an adult: Get an exam within 5 years of diagnosis and then yearly.  Diagnosed with type 2 diabetes: Get an exam as soon as possible after the diagnosis and then yearly. Foot care exam  Visual foot exams are performed at every routine medical visit. The exams check for cuts, injuries, or other problems with the feet.  A comprehensive foot exam should be done yearly. This includes visual inspection as well as assessing foot pulses and testing for loss of sensation. Kidney function test (urine microalbumin)  Performed once a year.  Type 1 diabetes: The first test is performed 5 years after diagnosis.  Type 2 diabetes: The first test is performed at the time of diagnosis.  A serum creatinine and estimated glomerular filtration rate (eGFR) test is done once a year to tell the level of chronic kidney disease (CKD), if present. Lipid profile (Cholesterol, HDL, LDL, Triglycerides)  Performed every 5 years for most people.  The goal for LDL is less than 100 mg/dl. If at high risk, the goal is less than 70 mg/dl.  The goal for HDL is 40 mg/dl 50 mg/dl for men and 50 mg/dl 60 mg/dl for women. An HDL cholesterol of 60 mg/dL or higher gives some protection against heart disease.  The goal for triglycerides is less than 150 mg/dl. Influenza vaccine, pneumococcal vaccine, and hepatitis B vaccine  The influenza vaccine is recommended yearly.  The pneumococcal vaccine is generally given  once in a lifetime. However, there are some instances when another vaccination is recommended. Check with your caregiver.  The hepatitis B vaccine is also recommended for adults with diabetes. Diabetes self-management education  Recommended at diagnosis and ongoing as needed. Treatment plan  Reviewed at every medical visit. Document Released: 07/29/2009 Document Revised: 09/17/2012 Document Reviewed: 04/03/2011 [facility] Patient Information 2014 [place], [place].

## 2013-07-09 NOTE — Assessment & Plan Note (Signed)
Pt not on aspirin because she says she cannot afford it Will recheck lipid profile today If elevated, consider lovastatin- on [facility] 4$ list

## 2013-07-09 NOTE — Assessment & Plan Note (Signed)
Well controlled on current regimen Continue current meds Will check CBC, BMET today

## 2013-07-09 NOTE — Progress Notes (Signed)
Subjective:    Patient ID: Sarah Kirk, female    DOB: 07-12-1933 MRN: [id number]    Primary is Sarah Kirk 1st floor Sarah Kirk in [place] [facility]  Sarah Kirk [facility]    Brief patient profile:  79 yowf quit smoking 1981 at wt < 170-200  eval in 2010 in pulmonary clinic with mostly restrictive changes c/w obesity.   Previous w/u: baseline = Nov 2008 dx with chf transiently better and fine able to walk outside up incline until Feb 09 gradual worsening to point of sob rm to rm no noct awakening just sob if she has to get out of bed to go to the bathroom. neb doesn't help much.  06/10/08 initial eval, copd vs ace, ace d/c'd   July 14, 2008 ov 100% better to her satisfaction after stopped ace, returns for PFT's, no limiiting sob, cough, cp. only finding was disproportionate reduction in diffusing capacity > no change rx  October 19, 2008 ov  Because of obesity I worried about occult thromboembolic disease and obtained a CT scan of her chest which was normal except for elevated right heart structures suggesting chronic pulmonary hypertension. This lead to overnight sleep oximetry showing 25 minutes of desaturation for which I recommended she be placed on oxygen at 2 L but she says she feels better when she sleeps without it.   rec noct 02 titrated to adequate sats Serial echo to follow ? PAH > to see [facility] .     12/10/2012 1st pulmonary ov in EPIC era with wt drop from 272  Down to 190  Early in 2013  With no problem  Breathing and no need for 02 then started downhill since Feb 2013 with steady wt gain  Since then fell June 2013    fell broke 2 ribs >  now sob room to room, placed on 02 prn since a year ago. No better on neb. rec bystolic 10 mg twice daily in place of atenolol Only use nebulizer if you feel it really helps you Wear 02 2lpm 24 hours per day for  Now     .01/28/2013 f/u ov/Sarah Kirk f/u sob ? Etiology Chief Complaint  Patient presents with  .  Acute Visit    Breathing worse for the past 2 wks, gets out of breath with any exertion at all, such as standing to weigh on the scale today.   sleeps on side on 02 ok horizontal and no increase in chronic leg swelling or cp of any kind. Sob is moderate and comes on just with exertion, not at rest, not clear using 02 as directed - not clear whether may have received macrodantin recently for uti (daughter thinks so but unclear on specifics) bystolic 10 mg daily instead of tenormin until you come back Try nebulizer when your breathing is bad at rest or you know a certain  activity is going to make you short of breath to see if it helps.  Try prilosec 20mg   Take 30-60 min before first meal of the day and Pepcid 20 mg one bedtime  GERD diet     02/12/13 Follow up and med calendar  Returns for follow up and med review  We reviewed all her meds and organized them into a med calendar with pt education .  Finished prednisone yesterday.  Hospital records reviewed from [facility].  Recently admitted to [facility] for new onset Atrial fib w/ CHF. COPD flare  Atrial fib was tx w /rate control , no anticoagulation.  CT chest showed mild chronic interstitial lung disease ? NSIP  She was tx w/ IV steroids and diureis. No discharge summary avail  She is feeling better but still weak.  Denies chest pain, orthopnea, syncope or fever.  Wt is down 13 lbs , edema is less rec Follow med calendar closely and bring to each visit.  Return in 2 weeks for PFT and office visit with Sarah Kirk   We are referring you to PCP within [facility] .   02/23/13 Admit to [facility] with dehydration correlating with complete resolution of peripheral edema   03/24/2013 f/u ov/Sarah Kirk 02 24/7 2 at rest and 4 lpm x walkng Chief Complaint  Patient presents with  . Follow-up    Breathing is unchanged since the last visit   sob if not wearing 02 or if walking s increasing to 4lpm.   rec 02 2lpm at rest and 4lpm with activity Wear elastic  stockings (TEDS) and elevate legs as much as possible  Ok to hold furosemide completely and the metazolone if leg swelling is completely gone For now take furosemide one daily and metazolone one every other day For excess swelling take one extra furosemide daily  See Sarah Kirk next and let her manage your diuretics    07/09/2013 f/u ov/Sarah Kirk: 02 dep resp failure  Chief Complaint  Patient presents with  . Follow-up    Pt c/o increased SOB for the past 2 months-relates to anxiety. She is using her o2 all of the time now.    no cough. Using 02 2lpm at rest Uses gets up and walks one end of house to the other on 4lpm Uses scooter to shop.   No obvious daytime variabilty or assoc chronic cough or cp or chest tightness, subjective wheeze overt sinus or hb symptoms. No unusual exp hx or h/o childhood pna/ asthma or premature birth to her knowledge.   Sleeping ok without nocturnal  or early am exacerbation  of respiratory  c/o's or need for noct saba. Also denies any obvious fluctuation of symptoms with weather or environmental changes or other aggravating or alleviating factors except as outlined above   Current Medications, Allergies, Past Medical History, Past Surgical History, Family History, and Social History were reviewed in [facility] record.  ROS  The following are not active complaints unless bolded sore throat, dysphagia, dental problems, itching, sneezing,  nasal congestion or excess/ purulent secretions, ear ache,   fever, chills, sweats, unintended wt loss, pleuritic or exertional cp, hemoptysis,  orthopnea pnd or leg swelling, presyncope, palpitations, heartburn, abdominal pain, anorexia, nausea, vomiting, diarrhea  or change in bowel or urinary habits, change in stools or urine, dysuria,hematuria,  rash, arthralgias, visual complaints, headache, numbness weakness or ataxia or problems with walking or coordination,  change in mood/affect or memory.           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 Nl external ear canals without cough reflex   NECK :  without JVD/Nodes/TM/ nl carotid upstrokes bilaterally   LUNGS: no acc muscle use, clear to A and P bilaterally without cough on insp or exp maneuvers   CV: irreg  no s3 or murmur or increase in P2,  Tr-1+bil lower ext edema   ABD:  soft and nontender with limited excursion with inspiration. No bruits or organomegaly, bowel sounds nl  MS:  warm without deformities, calf tenderness, cyanosis or clubbing  SKIN: warm and dry without lesions        CT chest 02/06/13  Suspected mild chronic interstitial lung disease, possibly>nonspecific interstitial pneumonitis (NSIP), progressed from 2011. Small mediastinal lymph nodes, as described above, likely reactive No evidence of acute cardiopulmonary disease.        Assessment & Plan:

## 2013-07-09 NOTE — Assessment & Plan Note (Signed)
On prozac but with worsening anxiety Will add Xanax 0.25 mg BID

## 2013-07-09 NOTE — Patient Instructions (Addendum)
02 2lpm at rest and 4lpm with activity but you must walk at a much slower pace   Please remember to go to the lab  department downstairs for your tests - we will call you with the results when they are available.  Wear 02 2lpm at rest then 4 lpm walking and leave on 4 lpm until recover breath after walking  Please schedule a follow up office visit in 6 weeks, call sooner if needed

## 2013-07-09 NOTE — Assessment & Plan Note (Addendum)
Followed in Pulmonary clinic/ [facility]/ Sarah Kirk - Resting sats on 2lpm 07/09/2013 =  96%  Vs walking on 4lpm = desat x 100 ft walking at a near nl pace  She needs to learn to pace herself better or will need a higher flow with exercise - since her legs given out just about the same time her sats fall I doubt turning up the 02 will help so rec slower pace for her and follow serial walking sats here

## 2013-07-10 ENCOUNTER — Encounter: Payer: Self-pay | Admitting: Internal Medicine

## 2013-07-10 NOTE — Progress Notes (Signed)
Quick Note:  Spoke with pt and notified of results per Dr. Wert. Pt verbalized understanding and denied any questions.  ______ 

## 2013-07-10 NOTE — Assessment & Plan Note (Signed)
-   last macrodantin exposure 03/08/13 x 10 d - PFTs 03/24/2013 vc 1.68 (52%) no obstruction with dlco 24% corrects to 49%  - ESR 256 07/09/13   Dx is made clinically and no elevation of ESR so really nothing to offer but 02 at this point  See instructions for specific recommendations which were reviewed directly with the patient who was given a copy with highlighter outlining the key components.

## 2013-07-22 ENCOUNTER — Other Ambulatory Visit: Payer: Self-pay | Admitting: *Deleted

## 2013-07-22 MED ORDER — CLONIDINE HCL 0.1 MG PO TABS: 0.1000 mg | ORAL_TABLET | Freq: Two times a day (BID) | ORAL | Status: DC

## 2013-08-06 ENCOUNTER — Ambulatory Visit: Payer: Self-pay

## 2013-08-19 ENCOUNTER — Other Ambulatory Visit: Payer: Self-pay | Admitting: Family Medicine

## 2013-08-19 DIAGNOSIS — I5042 Chronic combined systolic (congestive) and diastolic (congestive) heart failure: Secondary | ICD-10-CM

## 2013-08-19 MED ORDER — FUROSEMIDE 40 MG PO TABS: 40.0000 mg | ORAL_TABLET | Freq: Every day | ORAL | Status: DC

## 2013-08-20 ENCOUNTER — Ambulatory Visit (INDEPENDENT_AMBULATORY_CARE_PROVIDER_SITE_OTHER): Payer: Medicare Other | Admitting: Internal Medicine

## 2013-08-20 ENCOUNTER — Other Ambulatory Visit: Payer: Self-pay | Admitting: Family Medicine

## 2013-08-20 ENCOUNTER — Encounter: Payer: Self-pay | Admitting: Internal Medicine

## 2013-08-20 VITALS — BP 114/80 | HR 60 | Temp 97.4°F | Ht 68.0 in | Wt 244.0 lb

## 2013-08-20 DIAGNOSIS — J9610 Chronic respiratory failure, unspecified whether with hypoxia or hypercapnia: Secondary | ICD-10-CM

## 2013-08-20 DIAGNOSIS — J8410 Pulmonary fibrosis, unspecified: Secondary | ICD-10-CM

## 2013-08-20 DIAGNOSIS — J209 Acute bronchitis, unspecified: Secondary | ICD-10-CM | POA: Insufficient documentation

## 2013-08-20 MED ORDER — AMOXICILLIN-POT CLAVULANATE 875-125 MG PO TABS: 1.0000 | ORAL_TABLET | Freq: Two times a day (BID) | ORAL | Status: DC

## 2013-08-20 NOTE — Assessment & Plan Note (Signed)
In setting of uri/ rhinitis and persistent x sev weeks ? Fueled by gerd from popping peppermints all day  rec  Reviewed diet Continue acid suppression augmentin x 10 days in case sinus dz

## 2013-08-20 NOTE — Assessment & Plan Note (Signed)
-   Resting sats on 2lpm 07/09/2013 =  96% vs walking on 4lpm = desat x 100 ft walking at a near nl pace   rx 02 2lpm at rest and 4lpm with exertion.  Adequate control on present rx, reviewed > no change in rx needed

## 2013-08-20 NOTE — Assessment & Plan Note (Signed)
-   last macrodantin exposure 03/08/13 x 10 d - PFTs 03/24/2013 vc 1.68 (52%) no obstruction with dlco 24% corrects to 49%  - ESR 25  07/09/13   No evidence of progression off macrodantin but no better either > no change rx

## 2013-08-20 NOTE — Progress Notes (Signed)
Subjective:    Patient ID: Sarah Kirk, female    DOB: September 11, 1933 MRN: [id number]    Primary is Sarah Kirk 1st floor Sarah Kirk in [place] [facility]  Sarah Kirk [facility]    Brief patient profile:  79 yowf quit smoking 1981 at wt < 170-200  eval in 2010 in pulmonary clinic with mostly restrictive changes c/w obesity.    History of Present Illness  Previous w/u: baseline = Nov 2008 dx with chf transiently better and fine able to walk outside up incline until Feb 09 gradual worsening to point of sob rm to rm no noct awakening just sob if she has to get out of bed to go to the bathroom. neb doesn't help much.  06/10/08 initial eval, copd vs ace, ace d/c'd   July 14, 2008 ov 100% better to her satisfaction after stopped ace, returns for PFT's, no limiiting sob, cough, cp. only finding was disproportionate reduction in diffusing capacity > no change rx  October 19, 2008 ov  Because of obesity I worried about occult thromboembolic disease and obtained a CT scan of her chest which was normal except for elevated right heart structures suggesting chronic pulmonary hypertension. This lead to overnight sleep oximetry showing 25 minutes of desaturation for which I recommended she be placed on oxygen at 2 L but she says she feels better when she sleeps without it.   rec noct 02 titrated to adequate sats Serial echo to follow ? PAH > to see [facility] .     12/10/2012 1st pulmonary ov in EPIC era with wt drop from 272  Down to 190  Early in 2013  With no problem  Breathing and no need for 02 then started downhill since Feb 2013 with steady wt gain  Since then fell June 2013    fell broke 2 ribs >  now sob room to room, placed on 02 prn since a year ago. No better on neb. rec bystolic 10 mg twice daily in place of atenolol Only use nebulizer if you feel it really helps you Wear 02 2lpm 24 hours per day for  Now     .01/28/2013 f/u ov/[facility] f/u sob ? Etiology Chief Complaint   Patient presents with  . Acute Visit    Breathing worse for the past 2 wks, gets out of breath with any exertion at all, such as standing to weigh on the scale today.   sleeps on side on 02 ok horizontal and no increase in chronic leg swelling or cp of any kind. Sob is moderate and comes on just with exertion, not at rest, not clear using 02 as directed - not clear whether may have received macrodantin recently for uti (daughter thinks so but unclear on specifics) bystolic 10 mg daily instead of tenormin until you come back Try nebulizer when your breathing is bad at rest or you know a certain  activity is going to make you short of breath to see if it helps.  Try prilosec 20mg   Take 30-60 min before first meal of the day and Pepcid 20 mg one bedtime  GERD diet     02/12/13 Follow up and med calendar  Returns for follow up and med review  We reviewed all her meds and organized them into a med calendar with pt education .  Finished prednisone yesterday.  Hospital records reviewed from [place].  Recently admitted to [facility] for new onset Atrial fib w/ CHF. COPD flare  Atrial fib was tx w /  rate control , no anticoagulation.  CT chest showed mild chronic interstitial lung disease ? NSIP  She was tx w/ IV steroids and diureis. No discharge summary avail  She is feeling better but still weak.  Denies chest pain, orthopnea, syncope or fever.  Wt is down 13 lbs , edema is less rec Follow med calendar closely and bring to each visit.  Return in 2 weeks for PFT and office visit with Sarah Kirk   We are referring you to PCP within [place] .   02/23/13 Admit to [facility] with dehydration correlating with complete resolution of peripheral edema   03/24/2013 f/u ov/Sarah Kirk 02 24/7 2 at rest and 4 lpm x walkng Chief Complaint  Patient presents with  . Follow-up    Breathing is unchanged since the last visit   sob if not wearing 02 or if walking s increasing to 4lpm.   rec 02 2lpm at rest and 4lpm with  activity Wear elastic stockings (TEDS) and elevate legs as much as possible  Ok to hold furosemide completely and the metazolone if leg swelling is completely gone For now take furosemide one daily and metazolone one every other day For excess swelling take one extra furosemide daily  See Sarah Kirk next and let her manage your diuretics    07/09/2013 f/u ov/Sarah Kirk re: 02 dep resp failure  Chief Complaint  Patient presents with  . Follow-up    Pt c/o increased SOB for the past 2 months-relates to anxiety. She is using her o2 all of the time now.    no cough. Using 02 2lpm at rest Uses gets up and walks one end of house to the other on 4lpm Uses scooter to shop. rec 02 2lpm at rest and 4lpm with activity but you must walk at a much slower pace     08/20/2013 f/u ov/Sarah Kirk re: PF/ 02 dep c/w macrodantin Chief Complaint  Patient presents with  . Follow-up    [name]- prod with large amounts of yellow sputum.  Has used neb once in the past wk.    more nasal congestion with cough but not worse breathing Using lots of peppermint for cough    No obvious daytime variabilty or assoc  cp or chest tightness, subjective wheeze overt sinus or hb symptoms. No unusual exp hx or h/o childhood pna/ asthma or premature birth to her knowledge.   Sleeping ok without nocturnal  or early am exacerbation  of respiratory  c/o's or need for noct saba. Also denies any obvious fluctuation of symptoms with weather or environmental changes or other aggravating or alleviating factors except as outlined above   Current Medications, Allergies, Past Medical History, Past Surgical History, Family History, and Social History were reviewed in [employer] record.  ROS  The following are not active complaints unless bolded sore throat, dysphagia, dental problems, itching, sneezing,  nasal  congestion or excess/ purulent secretions, ear ache,   fever, chills, sweats, unintended wt loss, pleuritic or exertional cp, hemoptysis,  orthopnea pnd or leg swelling, presyncope, palpitations, heartburn, abdominal pain, anorexia, nausea, vomiting, diarrhea  or change in bowel or urinary habits, change in stools or urine, dysuria,hematuria,  rash, arthralgias, visual complaints, headache, numbness weakness or ataxia or problems with walking or coordination,  change in mood/affect or memory.          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 Nl external ear canals without cough reflex   NECK :  without JVD/Nodes/TM/ nl carotid upstrokes bilaterally   LUNGS: no acc muscle use, clear to A and P bilaterally without cough on insp or exp maneuvers   CV:  RRR   no S3 or murmur or increase in P2,  2+ bil lower ext edema   ABD:  soft and nontender with limited excursion with inspiration. No bruits or organomegaly, bowel sounds nl  MS:  warm without deformities, calf tenderness, cyanosis or clubbing  SKIN: warm and dry without lesions        CT chest 02/06/13  Suspected mild chronic interstitial lung disease, possibly>nonspecific interstitial pneumonitis (NSIP),  progressed from 2011. Small mediastinal lymph nodes, as described above, likely reactive No evidence of acute cardiopulmonary disease.        Assessment & Plan:

## 2013-08-20 NOTE — Patient Instructions (Addendum)
Augmentin 875 mg take one pill twice daily  X 10 days - take at breakfast and supper with large glass of water.  It would help reduce the usual side effects (diarrhea and yeast infections) if you ate cultured yogurt at lunch.   For cough > mucinex dm 1200 mg every 12 hours  GERD (REFLUX)  is an extremely common cause of respiratory symptoms, many times with no significant heartburn at all.    It can be treated with medication, but also with lifestyle changes including avoidance of late meals, excessive alcohol, smoking cessation, and avoid fatty foods, chocolate, peppermint, colas, red wine, and acidic juices such as orange juice.  NO MINT OR MENTHOL PRODUCTS SO NO COUGH DROPS  USE SUGARLESS CANDY INSTEAD (jolley ranchers or Stover's)  NO OIL BASED VITAMINS - use powdered substitutes.    Please schedule a follow up visit in 3 months but call sooner if needed

## 2013-08-21 ENCOUNTER — Telehealth: Payer: Self-pay | Admitting: Internal Medicine

## 2013-08-21 MED ORDER — FLUOXETINE HCL 40 MG PO CAPS: 40.0000 mg | ORAL_CAPSULE | Freq: Every morning | ORAL | Status: DC

## 2013-08-21 NOTE — Telephone Encounter (Signed)
I spoke with [name]. She reports pt Augmentin will cost $20. Pt finally just picked this up.  Nothing further needed

## 2013-08-24 ENCOUNTER — Other Ambulatory Visit: Payer: Self-pay | Admitting: Internal Medicine

## 2013-08-24 MED ORDER — NEBIVOLOL HCL 10 MG PO TABS: 10.0000 mg | ORAL_TABLET | Freq: Every day | ORAL | Status: DC

## 2013-09-25 ENCOUNTER — Telehealth: Payer: Self-pay | Admitting: Internal Medicine

## 2013-09-25 NOTE — Telephone Encounter (Addendum)
Patient's daughter, [name], called regarding patient assistance request for Bystolic 10 mg.  Informed that pharmaceutical company informed us that Bystolic was on back order.  Patient given samples of Bystolic x 65month

## 2013-09-28 MED ORDER — NEBIVOLOL HCL 10 MG PO TABS: 10.0000 mg | ORAL_TABLET | Freq: Every day | ORAL | Status: DC

## 2013-09-28 NOTE — Telephone Encounter (Signed)
Samples placed up front for pick up.  

## 2013-10-13 ENCOUNTER — Telehealth: Payer: Self-pay | Admitting: Family Medicine

## 2013-10-13 NOTE — Telephone Encounter (Signed)
Patient Assistance medication (Bystolic) arrived at [facility].  I called pt and spoke with her daughter.  She said that pt is in the hospital with pneumonia and wanted to know if we could send the medication to [facility] for her to pick up since she does not know where our office is.  I asked her if pt was going to continue to see Nicki Reaper at our office and she said "at this point, I don't know".  I told her that we would send the medication but if they choose to be seen elsewhere we would no longer be able to order her medication for her.  Bystolic send to [place] office via [employer], attn: Truddie Hidden.  Pt's daughter informed.

## 2013-10-16 ENCOUNTER — Telehealth: Payer: Self-pay | Admitting: Internal Medicine

## 2013-10-16 NOTE — Telephone Encounter (Signed)
10/16/2013  Dois DavenportSandra, pt's daughter, called in wanting to know if patient assistance medication had arrived at the [facility] office from [facility].  No medications were located.  [facility] to [facility].

## 2013-10-16 NOTE — Telephone Encounter (Signed)
Pt's daughter called [facility] and says [facility] office did not have her mother's medication.  After checking chart note from 10/13/2013, it says medication was sent to Lou's attention.  I called Truddie HiddenLou to confirm and she says it's there and she will leave it at the front w/pt's name on it. Advised pt's daughter of this and daughter says someone will be there on Monday to pick up the med.

## 2013-11-02 ENCOUNTER — Telehealth: Payer: Self-pay

## 2013-11-02 NOTE — Telephone Encounter (Signed)
There is no significant drug interaction

## 2013-11-02 NOTE — Telephone Encounter (Signed)
Rhonda nurse with [facility] left v/m; pt was admitted to home health after discharge from hospital with pneumonia; drug interaction came up between Prozac and Xanax. Rhonda request cb.

## 2013-11-11 ENCOUNTER — Ambulatory Visit: Payer: Medicare Other | Admitting: Physician Assistant

## 2013-11-19 ENCOUNTER — Ambulatory Visit: Payer: Medicare Other | Admitting: Physician Assistant

## 2013-11-29 DIAGNOSIS — J449 Chronic obstructive pulmonary disease, unspecified: Secondary | ICD-10-CM

## 2013-11-29 DIAGNOSIS — J4489 Other specified chronic obstructive pulmonary disease: Secondary | ICD-10-CM

## 2013-11-29 DIAGNOSIS — J189 Pneumonia, unspecified organism: Secondary | ICD-10-CM

## 2013-12-03 ENCOUNTER — Ambulatory Visit: Payer: Medicare Other | Admitting: Physician Assistant

## 2013-12-08 ENCOUNTER — Ambulatory Visit: Payer: Medicare Other | Admitting: Physician Assistant

## 2013-12-15 ENCOUNTER — Ambulatory Visit (INDEPENDENT_AMBULATORY_CARE_PROVIDER_SITE_OTHER): Payer: Medicare Other | Admitting: Physician Assistant

## 2013-12-15 ENCOUNTER — Ambulatory Visit: Payer: Medicare Other

## 2013-12-15 ENCOUNTER — Encounter: Payer: Self-pay | Admitting: Physician Assistant

## 2013-12-15 VITALS — BP 112/70 | HR 89 | Temp 97.6°F | Wt 222.4 lb

## 2013-12-15 DIAGNOSIS — F32A Depression, unspecified: Secondary | ICD-10-CM

## 2013-12-15 DIAGNOSIS — F329 Major depressive disorder, single episode, unspecified: Secondary | ICD-10-CM

## 2013-12-15 DIAGNOSIS — F419 Anxiety disorder, unspecified: Secondary | ICD-10-CM

## 2013-12-15 DIAGNOSIS — F411 Generalized anxiety disorder: Secondary | ICD-10-CM

## 2013-12-15 DIAGNOSIS — E119 Type 2 diabetes mellitus without complications: Secondary | ICD-10-CM

## 2013-12-15 DIAGNOSIS — F3289 Other specified depressive episodes: Secondary | ICD-10-CM

## 2013-12-15 DIAGNOSIS — I10 Essential (primary) hypertension: Secondary | ICD-10-CM

## 2013-12-15 DIAGNOSIS — I509 Heart failure, unspecified: Secondary | ICD-10-CM

## 2013-12-15 DIAGNOSIS — I4891 Unspecified atrial fibrillation: Secondary | ICD-10-CM

## 2013-12-15 LAB — BASIC METABOLIC PANEL
BUN: 7 mg/dL (ref 6–23)
CALCIUM: 8.6 mg/dL (ref 8.4–10.5)
CO2: 32 mEq/L (ref 19–32)
CREATININE: 1.1 mg/dL (ref 0.4–1.2)
Chloride: 96 mEq/L (ref 96–112)
GFR: 49.96 mL/min — AB (ref >60.00)
Glucose, Bld: 52 mg/dL — ABNORMAL LOW (ref 70–99)
Potassium: 3.6 mEq/L (ref 3.5–5.1)
Sodium: 135 mEq/L (ref 135–145)

## 2013-12-15 LAB — CBC WITH DIFFERENTIAL/PLATELET
BASOS ABS: 0.1 10*3/uL (ref 0.0–0.1)
BASOS PCT: 0.6 % (ref 0.0–3.0)
EOS ABS: 0.6 10*3/uL (ref 0.0–0.7)
Eosinophils Relative: 5.1 % — ABNORMAL HIGH (ref 0.0–5.0)
HCT: 37.8 % (ref 36.0–46.0)
Hemoglobin: 12.5 g/dL (ref 12.0–15.0)
LYMPHS PCT: 20.8 % (ref 12.0–46.0)
Lymphs Abs: 2.4 10*3/uL (ref 0.7–4.0)
MCHC: 33 g/dL (ref 30.0–36.0)
MCV: 87.9 fl (ref 78.0–100.0)
Monocytes Absolute: 1.1 10*3/uL — ABNORMAL HIGH (ref 0.1–1.0)
Monocytes Relative: 9 % (ref 3.0–12.0)
Neutro Abs: 7.5 10*3/uL (ref 1.4–7.7)
Neutrophils Relative %: 64.5 % (ref 43.0–77.0)
Platelets: 315 10*3/uL (ref 150.0–400.0)
RBC: 4.3 Mil/uL (ref 3.87–5.11)
RDW: 15 % — AB (ref 11.5–14.6)
WBC: 11.6 10*3/uL — ABNORMAL HIGH (ref 4.5–10.5)

## 2013-12-15 LAB — HEPATIC FUNCTION PANEL
ALBUMIN: 3.1 g/dL — AB (ref 3.5–5.2)
ALT: 16 U/L (ref 0–35)
AST: 23 U/L (ref 0–37)
Alkaline Phosphatase: 149 U/L — ABNORMAL HIGH (ref 39–117)
Bilirubin, Direct: 0.2 mg/dL (ref 0.0–0.3)
Total Bilirubin: 1.3 mg/dL — ABNORMAL HIGH (ref 0.3–1.2)
Total Protein: 6.2 g/dL (ref 6.0–8.3)

## 2013-12-15 LAB — LIPID PANEL
CHOLESTEROL: 136 mg/dL (ref 0–200)
HDL: 45 mg/dL (ref >39.00)
LDL Cholesterol: 66 mg/dL (ref 0–99)
Total CHOL/HDL Ratio: 3
Triglycerides: 123 mg/dL (ref 0.0–149.0)
VLDL: 24.6 mg/dL (ref 0.0–40.0)

## 2013-12-15 MED ORDER — INSULIN NPH ISOPHANE & REGULAR (70-30) 100 UNIT/ML ~~LOC~~ SUSP: 14.0000 [IU] | Freq: Two times a day (BID) | SUBCUTANEOUS | Status: DC

## 2013-12-15 MED ORDER — FAMOTIDINE 20 MG PO TABS: 20.0000 mg | ORAL_TABLET | Freq: Every day | ORAL | Status: DC

## 2013-12-15 MED ORDER — FUROSEMIDE 40 MG PO TABS: 40.0000 mg | ORAL_TABLET | Freq: Every day | ORAL | Status: DC

## 2013-12-15 MED ORDER — FUROSEMIDE 40 MG PO TABS: 60.0000 mg | ORAL_TABLET | Freq: Two times a day (BID) | ORAL | Status: DC

## 2013-12-15 MED ORDER — OMEPRAZOLE 20 MG PO CPDR: 20.0000 mg | DELAYED_RELEASE_CAPSULE | Freq: Every day | ORAL | Status: DC

## 2013-12-15 MED ORDER — CLONIDINE HCL 0.1 MG PO TABS: 0.1000 mg | ORAL_TABLET | Freq: Two times a day (BID) | ORAL | Status: DC

## 2013-12-15 MED ORDER — FLUOXETINE HCL 40 MG PO CAPS: 40.0000 mg | ORAL_CAPSULE | Freq: Every morning | ORAL | Status: DC

## 2013-12-15 MED ORDER — POTASSIUM CHLORIDE CRYS ER 20 MEQ PO TBCR: 40.0000 meq | EXTENDED_RELEASE_TABLET | Freq: Every day | ORAL | Status: DC

## 2013-12-15 NOTE — Progress Notes (Signed)
Pre-visit discussion using our clinic review tool. No additional management support is needed unless otherwise documented below in the visit note.  

## 2013-12-15 NOTE — Progress Notes (Signed)
Subjective:    Patient ID: Sarah Kirk, female    DOB: 1933-02-19, 80 y.o.   MRN: [id number]  HPI Comments: Patient is a 80 year old female who presents to the office for follow up from recent hospital discharge. Patient is accompanied by her daughter who helps with history. [facility] for recent [facility]. Was sent to rehab center and had a recurrence of pneumonia and readmitted to hospital. Sent back to rehab center then discharged to home. Now has home therapy visit once weekly to help build stamina and strength for ambulation. Patient reports feeling better. Had weight loss of 47 pounds while hospitalized. Patient has cardiologist in [place] she follows with for her congestive heart failure and atrial fibrillation and pulmonologist, Dr Sherene SiresWert, for chronic respiratory failure. Patient is on portable oxygen 3L at rest and night time and 4L with activity. Patient reports while in hospital her Metolazore was discontinued due to dehydration. Would like to start back on the medication since she is experiencing an increase in LE swelling. Request refills of medications.  Denies chest pain, increase in SOB, N/V, bowel/bladder changes.    Review of Systems  Constitutional: Negative for fever and chills.  Eyes: Negative for visual disturbance.  Respiratory: Positive for shortness of breath (at baseline). Negative for chest tightness.   Cardiovascular: Positive for leg swelling (mild increase from baseline). Negative for chest pain.  Gastrointestinal: Negative for nausea and vomiting.  Neurological: Negative for dizziness, weakness, light-headedness and headaches.   Past Medical History  Diagnosis Date  . HBP (high blood pressure)   . CHF (congestive heart failure)   . HLD (hyperlipidemia)   . Morbid obesity     PFTs 07/14/08 moderate restriction vital capacity 58% with ERV 32% DLCO 38, corrects to 108  . Right heart enlargement     CT 07/16/08. nocturnal desat x 25 min  07/29/08  . Atrial fibrillation   . Diabetes mellitus without complication   . PONV (postoperative nausea and vomiting)   . Depression    Current Outpatient Prescriptions on File Prior to Visit  Medication Sig Dispense Refill  . ALPRAZolam (XANAX) 0.25 MG tablet Take 1 tablet (0.25 mg total) by mouth 2 (two) times daily as needed for sleep.  60 tablet  0  . aspirin 325 MG tablet Take 325 mg by mouth every morning.       Marland Kitchen. HYDROcodone-acetaminophen (NORCO) 5-325 MG per tablet Take 1 tablet by mouth every 6 (six) hours as needed for pain.  45 tablet  0  . ipratropium-albuterol (DUONEB) 0.5-2.5 (3) MG/3ML SOLN Take 3 mLs by nebulization every 6 (six) hours as needed (wheezing/shortness of breath).      . nebivolol (BYSTOLIC) 10 MG tablet Take 1 tablet (10 mg total) by mouth daily. Lot # [id number] exp 10/16  30 tablet  4  . metolazone (ZAROXOLYN) 5 MG tablet Take 0.5 tablets (2.5 mg total) by mouth every other day.  30 tablet  6   No current facility-administered medications on file prior to visit.       Objective:   Physical Exam  Constitutional: She is oriented to person, place, and time. No distress.  Patient is obese, seated comfortably in wheelchair.   HENT:  Head: Normocephalic and atraumatic.  Eyes: Conjunctivae are normal.  Neck: Normal range of motion.  Cardiovascular: Normal rate and regular rhythm.  Exam reveals no gallop and no friction rub.   No murmur heard. Pulses:      Posterior  tibial pulses are 1+ on the right side, and 1+ on the left side.  Pulmonary/Chest: Effort normal and breath sounds normal. She has no wheezes. She has no rales.  Musculoskeletal:  Bilateral LE with moderate +1 pitting edema. No erythema noted. NVI  Neurological: She is alert and oriented to person, place, and time.  Skin: Skin is warm and dry. She is not diaphoretic.  Psychiatric: She has a normal mood and affect.   Filed Vitals:   12/15/13 1344  BP: 112/70  Pulse: 89  Temp: 97.6 F (36.4  C)   Filed Weights   12/15/13 1344  Weight: 222 lb 6.4 oz (100.88 kg)       Assessment & Plan:   Labs ordered today: HgbA1c, CBC, BMET, HFT, microalbumin, lipid panel  DM: Novolog 70/30 14 units bid Controlled on current medication Labs ordered today  HTN Bystolic 10 mg once daily Clonidine 0.1 mg bid Continue on current medication  Anxiety/depression Fluoxitine 40 mg once daily Xanax 0.25 mg, one half tablet at bedtime Well controlled, continue on current medications  CHF/a-fib Need to schedule appointment with cardiologist and pulmonologist in next week Patient reports Furosemide prescribed as 40 mg bid Wants to add Metolazone back on, instructed patient to increase Furosemide to 60 mg bid and follow up with cardiology/pulmonology

## 2013-12-15 NOTE — Patient Instructions (Signed)
It was great to meet you today Sarah Kirk!   Labs have been ordered for you, when you report to lab please be fasting.   Please return to office in 3-4 weeks for evaluation of swelling.   Generalized Anxiety Disorder Generalized anxiety disorder (GAD) is a mental disorder. It interferes with life functions, including relationships, work, and school. GAD is different from normal anxiety, which everyone experiences at some point in their lives in response to specific life events and activities. Normal anxiety actually helps [place] prepare for and get through these life events and activities. Normal anxiety goes away after the event or activity is over.  GAD causes anxiety that is not necessarily related to specific events or activities. It also causes excess anxiety in proportion to specific events or activities. The anxiety associated with GAD is also difficult to control. GAD can vary from mild to severe. People with severe GAD can have intense waves of anxiety with physical symptoms (panic attacks).  SYMPTOMS The anxiety and worry associated with GAD are difficult to control. This anxiety and worry are related to many life events and activities and also occur more days than not for 6 months or longer. People with GAD also have three or more of the following symptoms (one or more in children):  Restlessness.   Fatigue.  Difficulty concentrating.   Irritability.  Muscle tension.  Difficulty sleeping or unsatisfying sleep. DIAGNOSIS GAD is diagnosed through an assessment by your caregiver. Your caregiver will ask you questions aboutyour mood,physical symptoms, and events in your life. Your caregiver may ask you about your medical history and use of alcohol or drugs, including prescription medications. Your caregiver may also do a physical exam and blood tests. Certain medical conditions and the use of certain substances can cause symptoms similar to those associated with GAD. Your  caregiver may refer you to a mental health specialist for further evaluation. TREATMENT The following therapies are usually used to treat GAD:   Medication Antidepressant medication usually is prescribed for long-term daily control. Antianxiety medications may be added in severe cases, especially when panic attacks occur.   Talk therapy (psychotherapy) Certain types of talk therapy can be helpful in treating GAD by providing support, education, and guidance. A form of talk therapy called cognitive behavioral therapy can teach you healthy ways to think about and react to daily life events and activities.  Stress managementtechniques These include yoga, meditation, and exercise and can be very helpful when they are practiced regularly. A mental health specialist can help determine which treatment is best for you. Some people see improvement with one therapy. However, other people require a combination of therapies. Document Released: 01/26/2013 Document Reviewed: 01/26/2013 [facility] Patient Information 2014 [place], [place]. Hypertension Hypertension is another name for high blood pressure. High blood pressure may mean that your heart needs to work harder to pump blood. Blood pressure consists of two numbers, which includes a higher number over a lower number (example: 110/72). HOME CARE   Make lifestyle changes as told by your doctor. This may include weight loss and exercise.  Take your blood pressure medicine every day.  Limit how much salt you use.  Stop smoking if you smoke.  Do not use drugs.  Talk to your doctor if you are using decongestants or birth control pills. These medicines might make blood pressure higher.  Females should not drink more than 1 alcoholic drink per day. Males should not drink more than 2 alcoholic drinks per day.  See  your doctor as told. GET HELP RIGHT AWAY IF:   You have a blood pressure reading with a top number of 180 or higher.  You get a very bad  headache.  You get blurred or changing vision.  You feel confused.  You feel weak, numb, or faint.  You get chest or belly (abdominal) pain.  You throw up (vomit).  You cannot breathe very well. MAKE SURE YOU:   Understand these instructions.  Will watch your condition.  Will get help right away if you are not doing well or get worse. Document Released: 03/19/2008 Document Revised: 12/24/2011 Document Reviewed: 03/19/2008 [facility] Patient Information 2014 [place], [place]. Diabetes and Exercise Exercising regularly is important. It is not just about losing weight. It has many health benefits, such as:  Improving your overall fitness, flexibility, and endurance.  Increasing your bone density.  Helping with weight control.  Decreasing your body fat.  Increasing your muscle strength.  Reducing stress and tension.  Improving your overall health. People with diabetes who exercise gain additional benefits because exercise:  Reduces appetite.  Improves the body's use of blood sugar (glucose).  Helps lower or control blood glucose.  Decreases blood pressure.  Helps control blood lipids (such as cholesterol and triglycerides).  Improves the body's use of the hormone insulin by:  Increasing the body's insulin sensitivity.  Reducing the body's insulin needs.  Decreases the risk for heart disease because exercising:  Lowers cholesterol and triglycerides levels.  Increases the levels of good cholesterol (such as high-density lipoproteins [HDL]) in the body.  Lowers blood glucose levels. YOUR ACTIVITY PLAN  Choose an activity that you enjoy and set realistic goals. Your health care provider or diabetes educator can help you make an activity plan that works for you. You can break activities into 2 or 3 sessions throughout the day. Doing so is as good as one long session. Exercise ideas include:  Taking the dog for a walk.  Taking the stairs instead of the  elevator.  Dancing to your favorite song.  Doing your favorite exercise with a friend. RECOMMENDATIONS FOR EXERCISING WITH TYPE 1 OR TYPE 2 DIABETES   Check your blood glucose before exercising. If blood glucose levels are greater than 240 mg/dL, check for urine ketones. Do not exercise if ketones are present.  Avoid injecting insulin into areas of the body that are going to be exercised. For example, avoid injecting insulin into:  The arms when playing tennis.  The legs when jogging.  Keep a record of:  Food intake before and after you exercise.  Expected peak times of insulin action.  Blood glucose levels before and after you exercise.  The type and amount of exercise you have done.  Review your records with your health care provider. Your health care provider will help you to develop guidelines for adjusting food intake and insulin amounts before and after exercising.  If you take insulin or oral hypoglycemic agents, watch for signs and symptoms of hypoglycemia. They include:  Dizziness.  Shaking.  Sweating.  Chills.  Confusion.  Drink plenty of water while you exercise to prevent dehydration or heat stroke. Body water is lost during exercise and must be replaced.  Talk to your health care provider before starting an exercise program to make sure it is safe for you. Remember, almost any type of activity is better than none. Document Released: 12/22/2003 Document Revised: 06/03/2013 Document Reviewed: 03/10/2013 [facility] Patient Information 2014 [place], [place].

## 2013-12-16 LAB — HEMOGLOBIN A1C
Hgb A1c MFr Bld: 4 % (ref <5.7)
MEAN PLASMA GLUCOSE: 68 mg/dL (ref <117)

## 2013-12-17 NOTE — Assessment & Plan Note (Signed)
Bystolic 10 mg once daily Clonidine 0.1 mg bid

## 2013-12-17 NOTE — Assessment & Plan Note (Signed)
CHF/a-fib Need to schedule appointment with cardiologist and pulmonologist in next week Patient reports Furosemide prescribed as 40 mg bid Wants to add Metolazone back on, instructed patient to increase Furosemide to 60 mg bid and follow up with cardiology/pulmonology

## 2013-12-17 NOTE — Assessment & Plan Note (Signed)
Anxiety/depression Fluoxitine 40 mg once daily Xanax 0.25 mg, one half tablet at bedtime Well controlled, continue on current medications

## 2013-12-18 IMAGING — CR DG CHEST 2V
3 series · 3 of 3 positions shown · non-contrast
Comparison: 02/04/2013

CLINICAL DATA: Cough, shortness of breath, nausea, history of CHF

CHEST - 2 VIEW

[w chest lat]
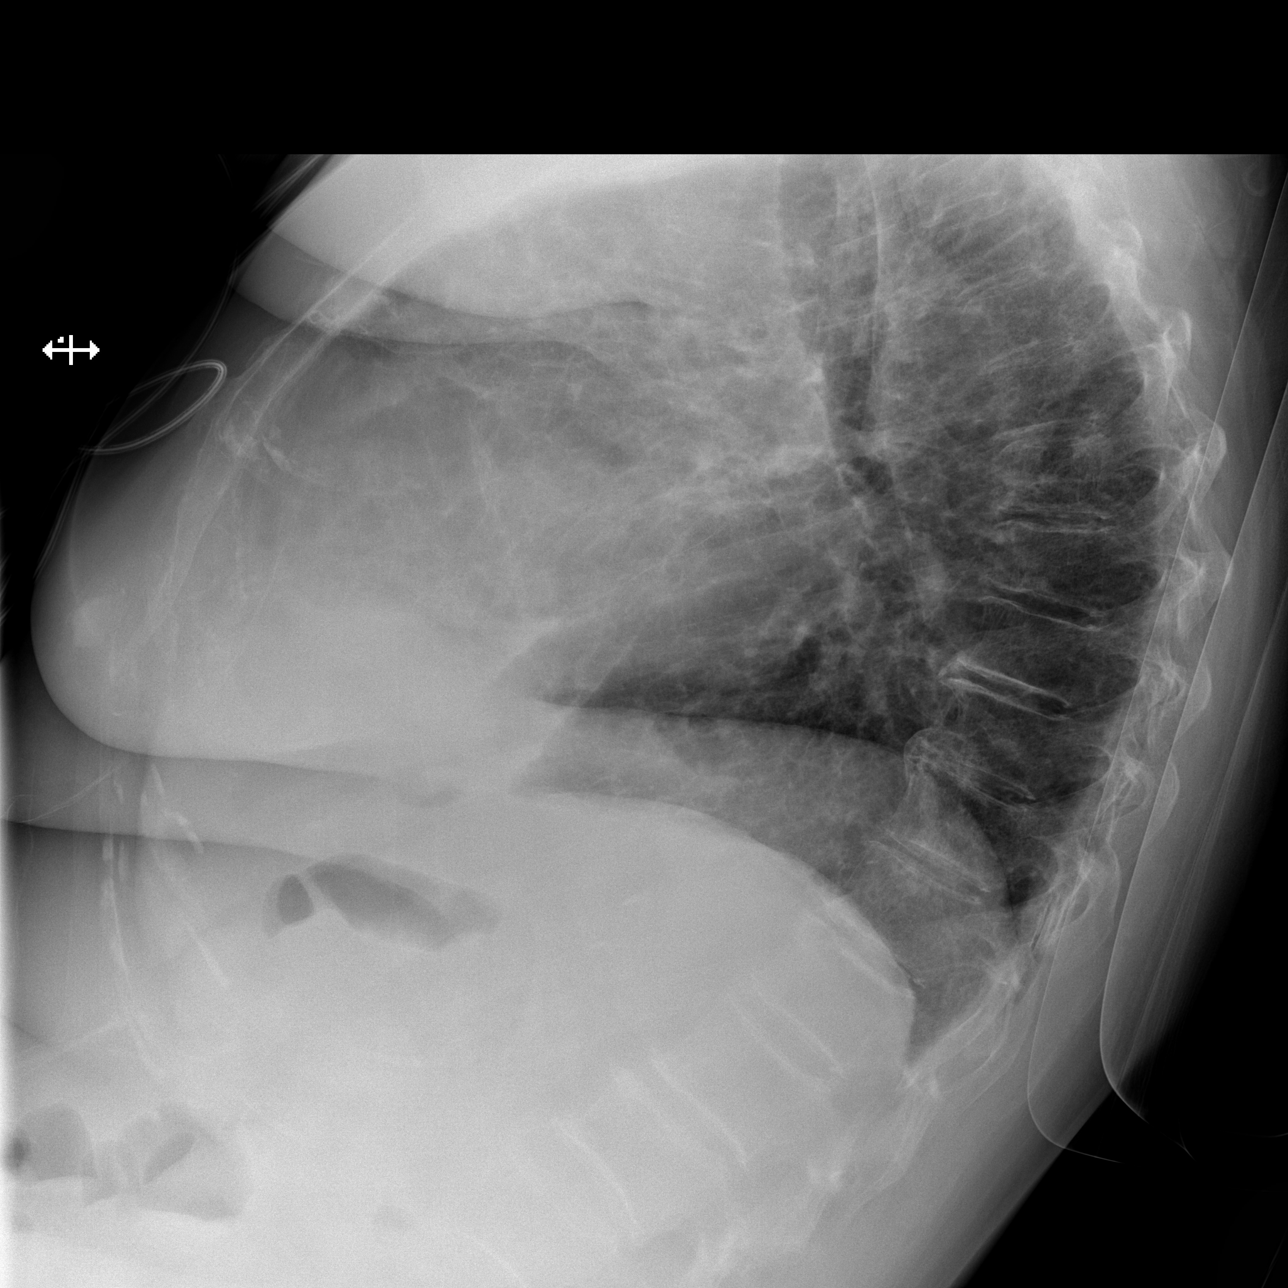

[x chest ap (1 of 2)]
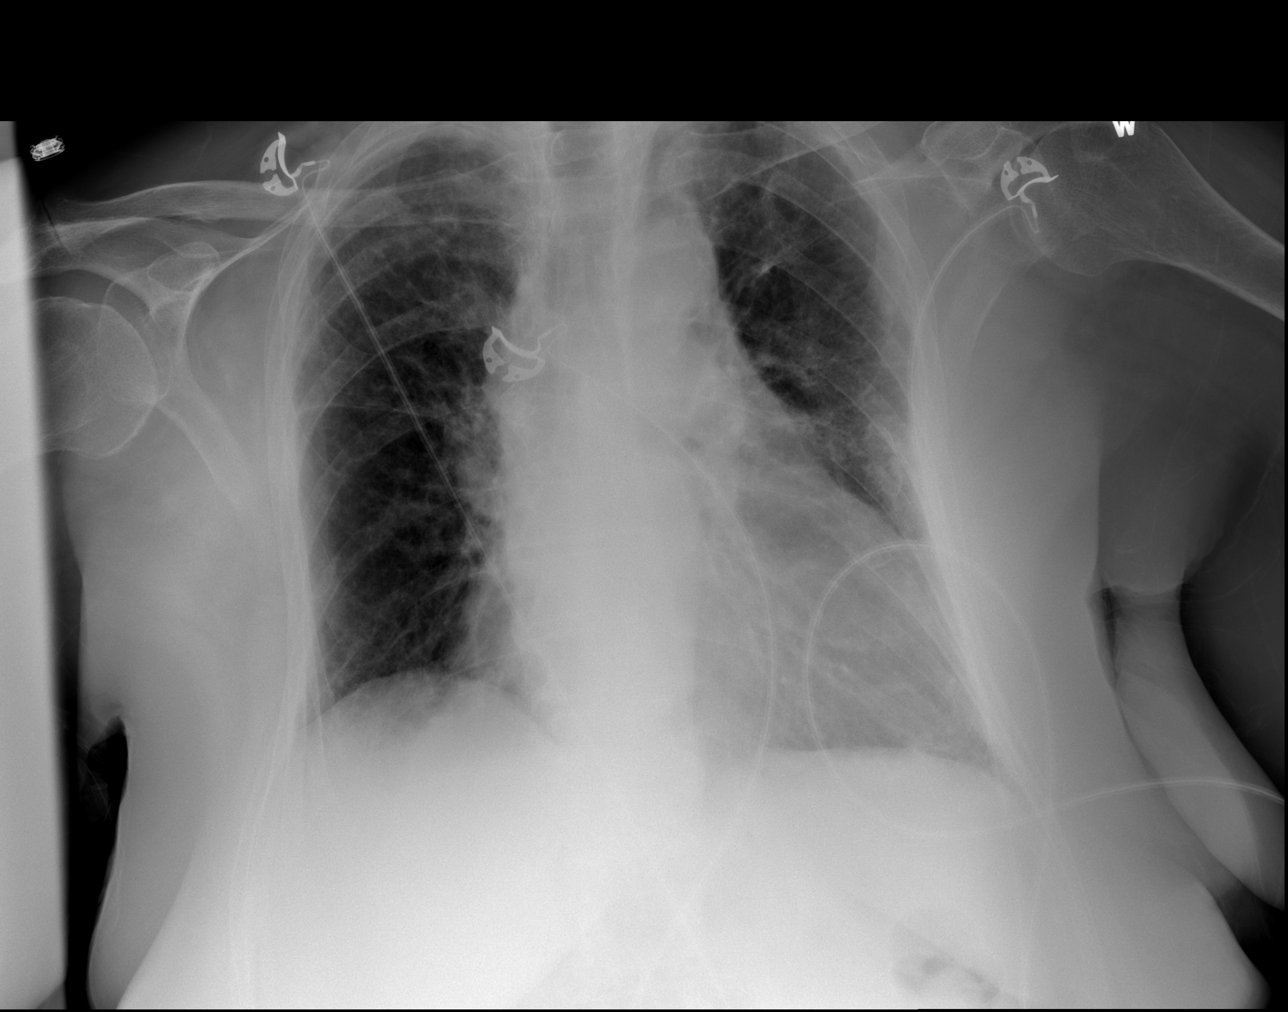

[x chest ap (2 of 2)]
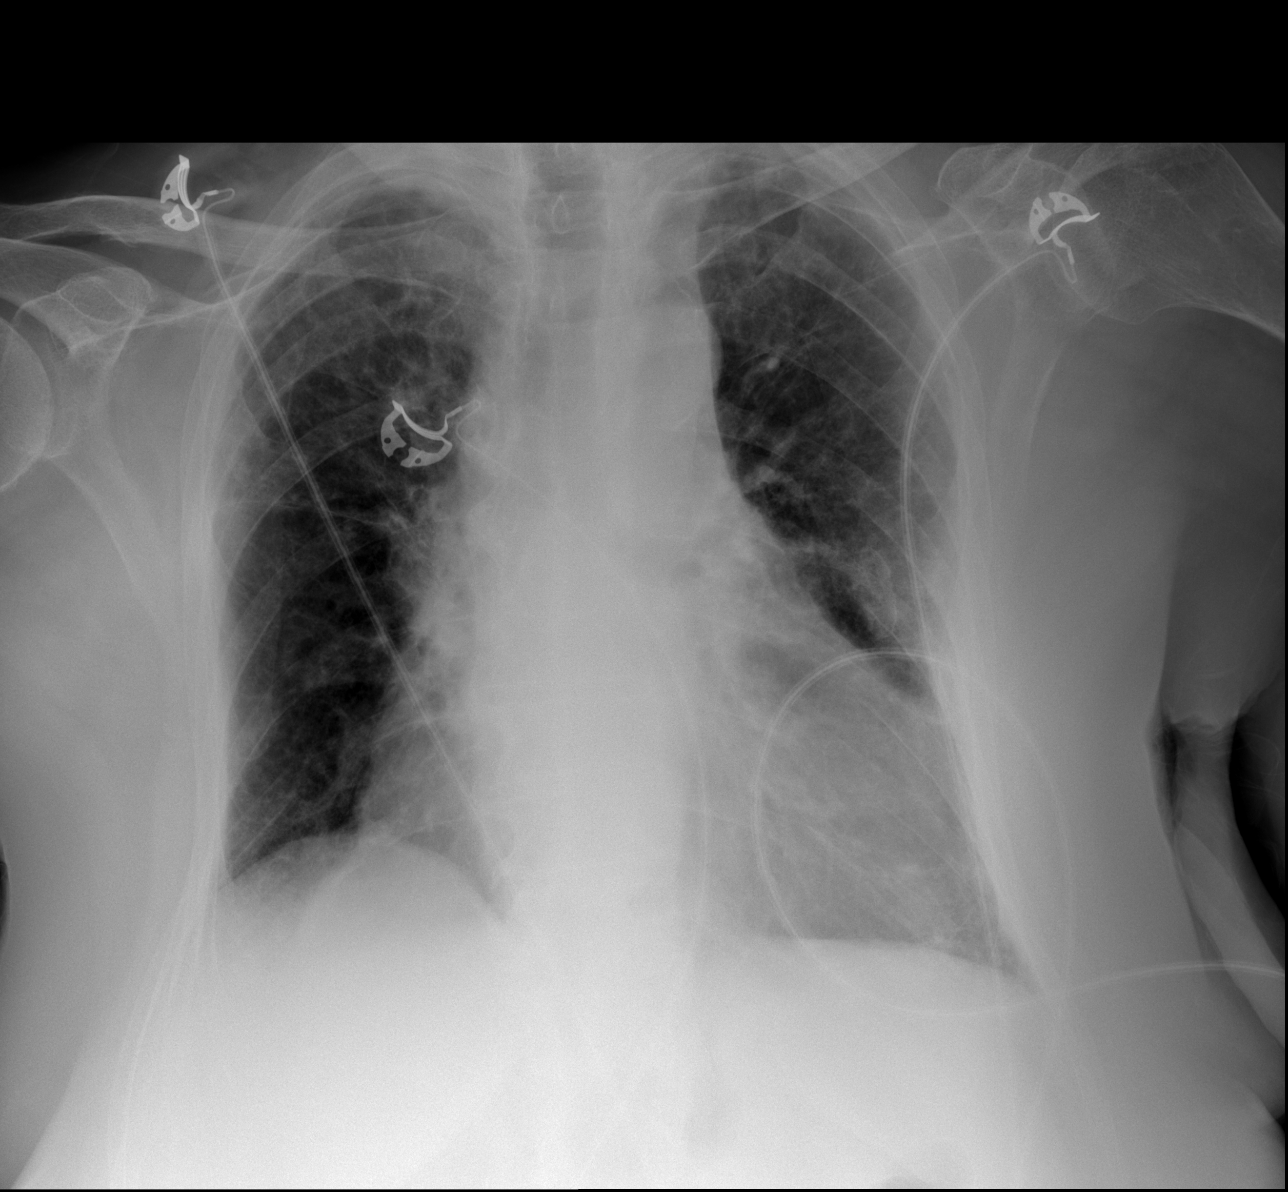

[3 of 3 positions shown; findings below may reference images not displayed]

FINDINGS: Enlargement of cardiac silhouette.
Stable mediastinal contours.
Chronic interstitial lung disease changes identified in the lungs
bilaterally, greatest in the right upper lobe and left mid lung.
Minimal thickening at the minor fissure.
No definite acute infiltrate, pleural effusion or pneumothorax.
Osseous demineralization.
Question calcification versus artifact projecting over the mid left
lung.
IMPRESSION: Chronic interstitial lung disease changes/fibrosis.
No acute abnormalities.

## 2013-12-18 IMAGING — CT CT HEAD W/O CM
2 series · 15 of 30 positions shown, 19 images · non-contrast
Comparison: None.

CLINICAL DATA: Difficulty urinating.  Weakness, shaking and
vomiting.

CT HEAD WITHOUT CONTRAST
TECHNIQUE: Contiguous axial images were obtained from the base of
the skull through the vertex without contrast.

[Series 2: head w/o · axial · non-contrast · 0.43mm/px · z∈[+1548,+1678]mm · 13 of 32 slices shown, 17 images]
[im 3/32  brain]
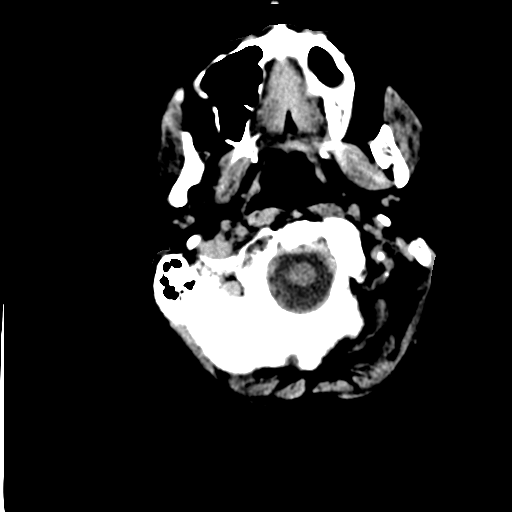
[im 3/32  bone]
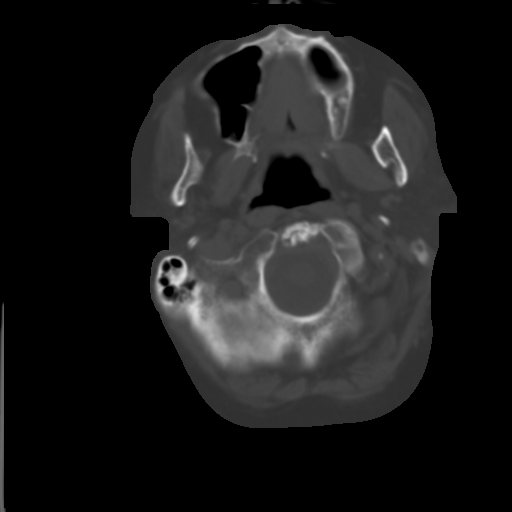
[im 5/32  brain]
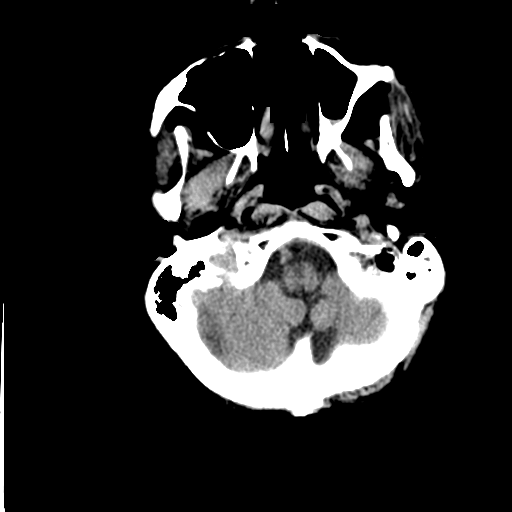
[im 7/32  brain]
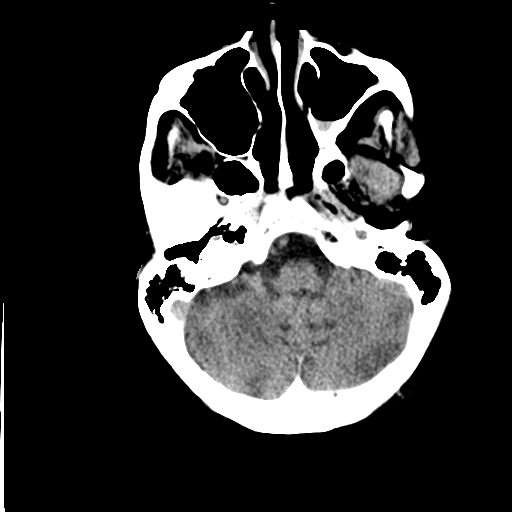
[im 9/32  brain]
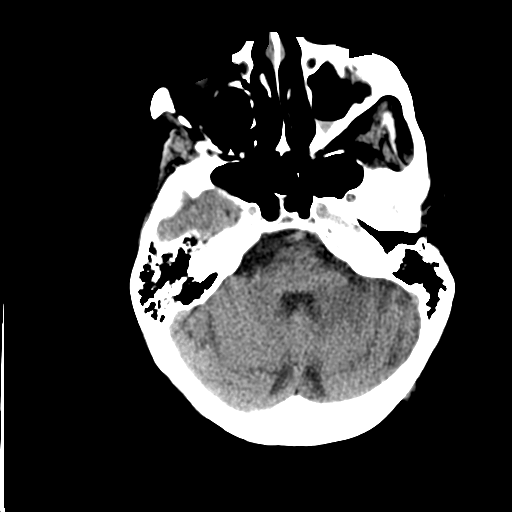
[im 12/32  brain]
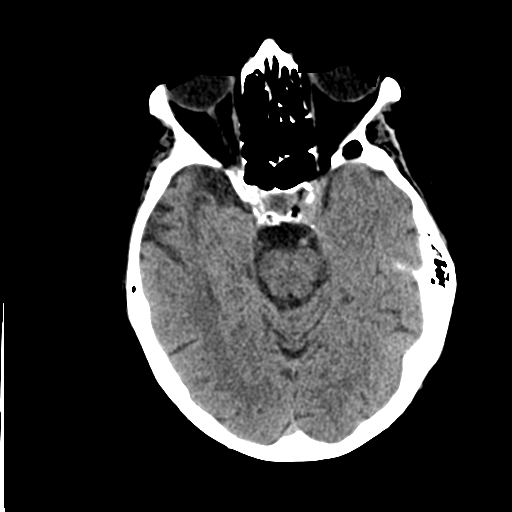
[im 12/32  bone]
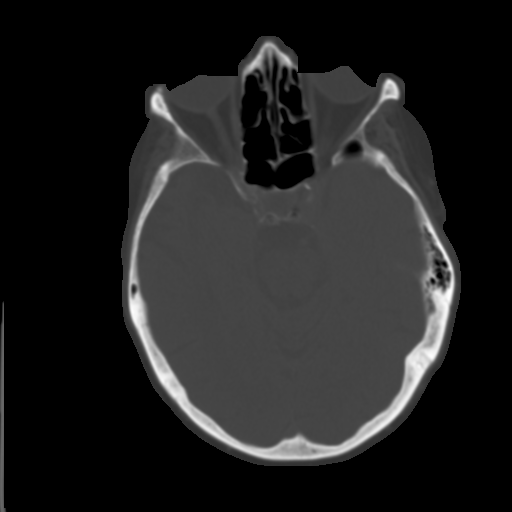
[im 14/32  brain]
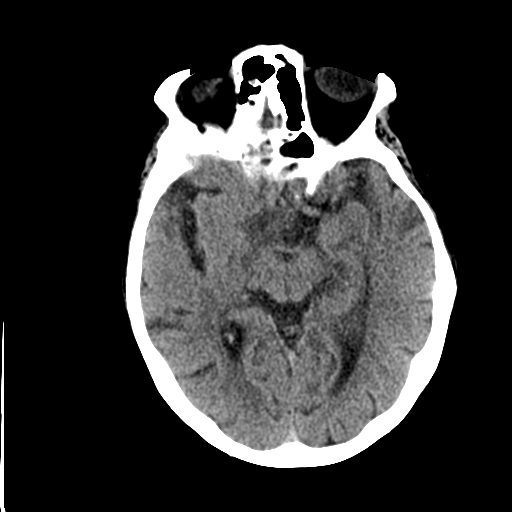
[im 16/32  brain]
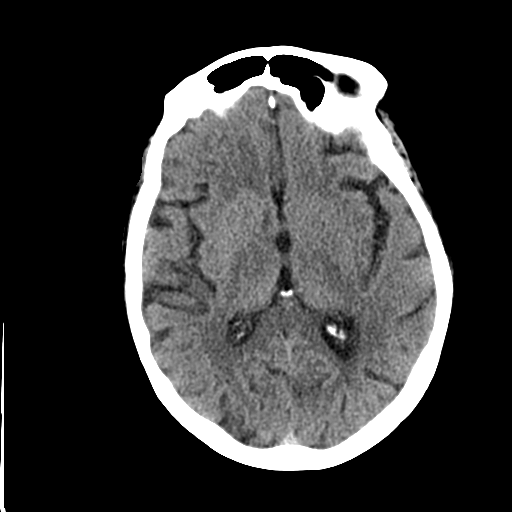
[im 18/32  brain]
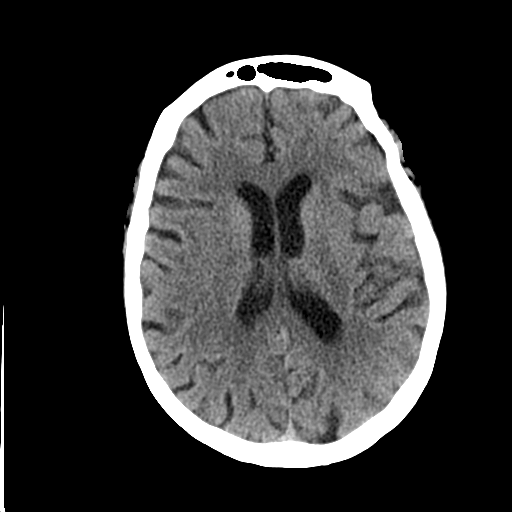
[im 20/32  brain]
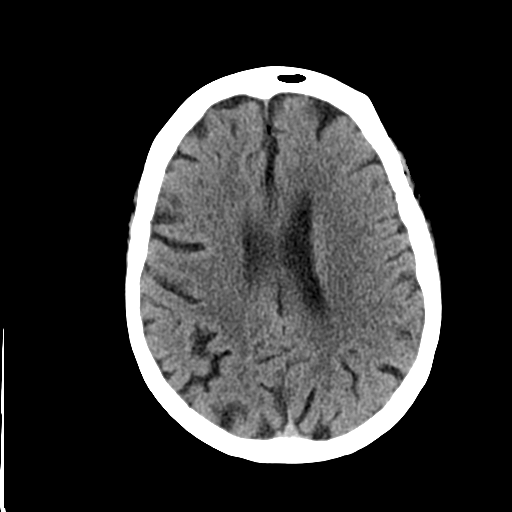
[im 20/32  bone]
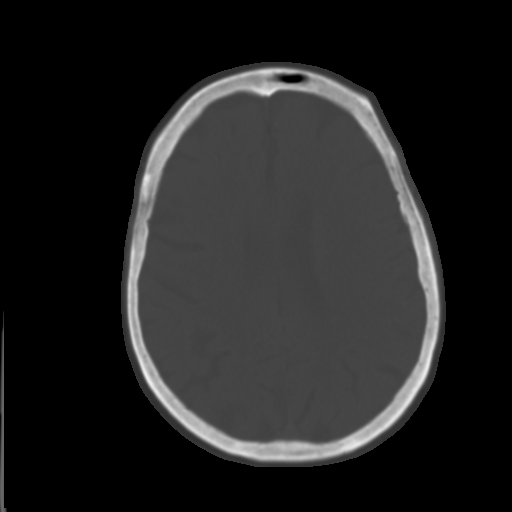
[im 23/32  brain]
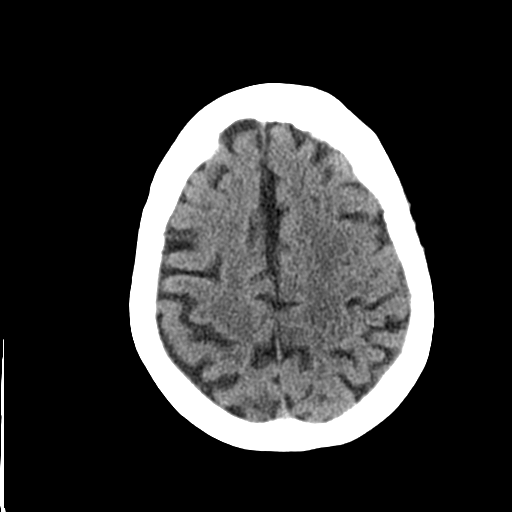
[im 25/32  brain]
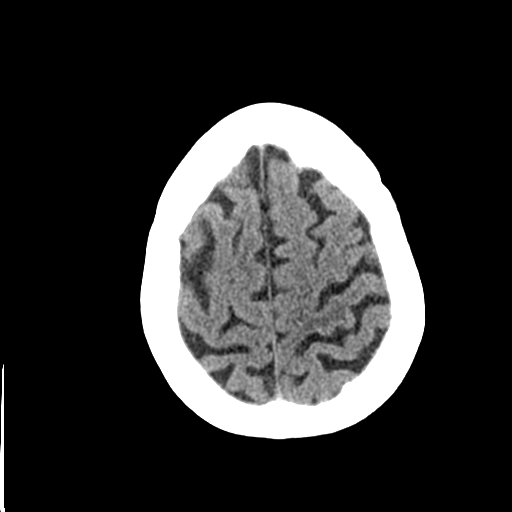
[im 27/32  brain]
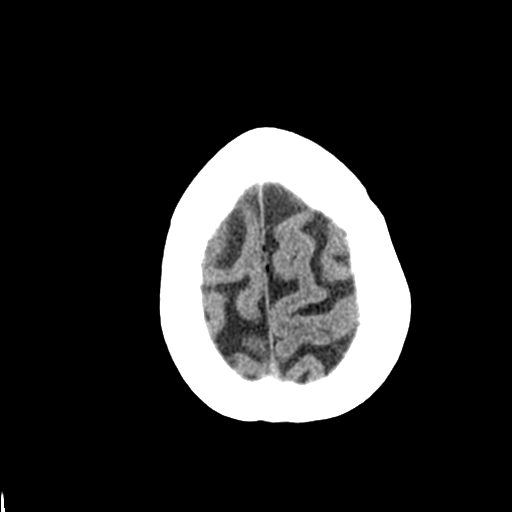
[im 29/32  brain]
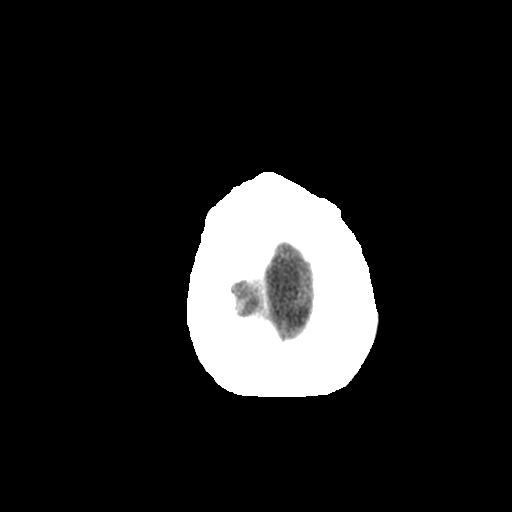
[im 29/32  bone]
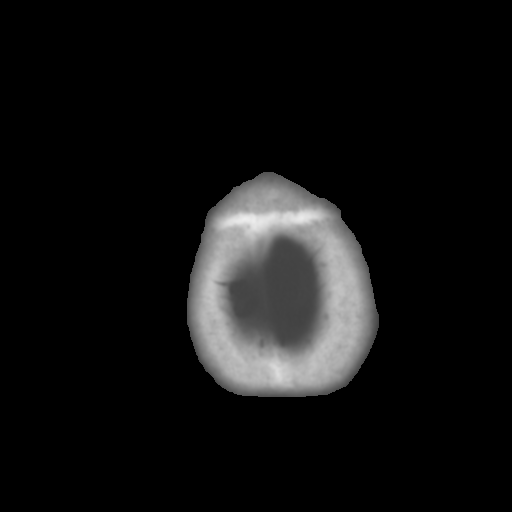

[Series 3: bone windows · axial · 0.43mm/px · z∈[+1548,+1568]mm · 2 of 32 slices shown]
[im 3/32  bone]
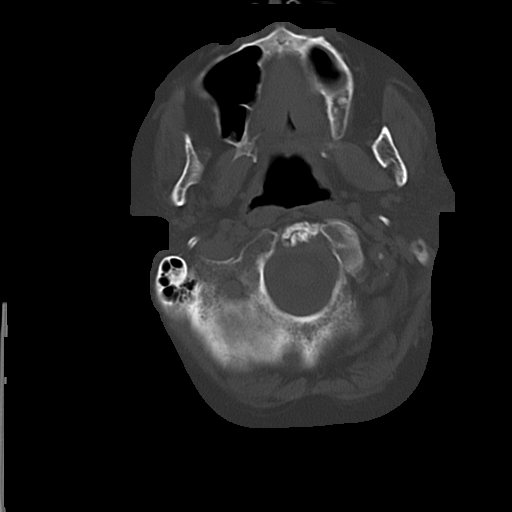
[im 7/32  bone]
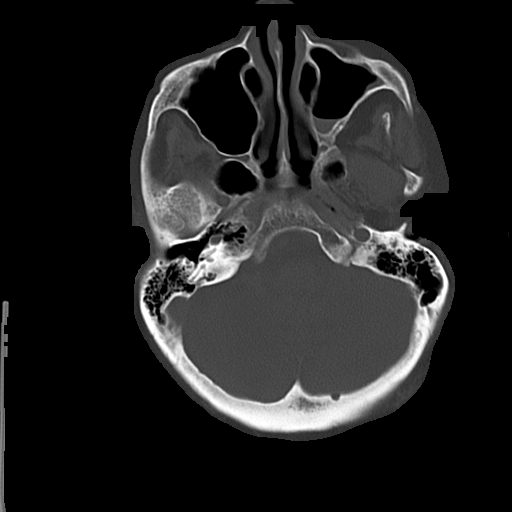

[15 of 30 positions shown; findings below may reference images not displayed]

FINDINGS: No intracranial hemorrhage.

Hypodensity right paracentral medulla and right aspect of the
cerebellum may be related to streak artifact although difficult to
exclude changes of acute infarct.

Small vessel disease type changes.

Mild global atrophy without hydrocephalus.

No intracranial mass lesion detected on this unenhanced exam..

Vascular calcifications.

Air-fluid level left maxillary sinus raising possibility acute
sinusitis.  Opacification anterior right ethmoid sinus air cell.
IMPRESSION: No intracranial hemorrhage.

Hypodensity right paracentral medulla and right aspect of the
cerebellum may be related to streak artifact although difficult to
exclude changes of acute infarct.

Small vessel disease type changes.

Mild global atrophy without hydrocephalus.

Air-fluid level left maxillary sinus raising possibility acute
sinusitis.

## 2013-12-31 ENCOUNTER — Telehealth: Payer: Self-pay | Admitting: Physician Assistant

## 2013-12-31 DIAGNOSIS — F411 Generalized anxiety disorder: Secondary | ICD-10-CM

## 2013-12-31 NOTE — Telephone Encounter (Signed)
Pt called requesting refill for Alprazolam and Fluoxetine to [facility]. They said they do not have the Fluoxetine. Please advise

## 2013-12-31 NOTE — Telephone Encounter (Signed)
Okay to fill both Sarah Kirk. Thanks.

## 2014-01-01 MED ORDER — ALPRAZOLAM 0.25 MG PO TABS: 0.2500 mg | ORAL_TABLET | Freq: Two times a day (BID) | ORAL | Status: DC | PRN

## 2014-01-01 MED ORDER — FLUOXETINE HCL 40 MG PO CAPS: 40.0000 mg | ORAL_CAPSULE | Freq: Every morning | ORAL | Status: DC

## 2014-01-01 NOTE — Telephone Encounter (Signed)
Notified pt refills has been sent back to pharmacy...Sarah Kirk/lmb

## 2014-01-13 ENCOUNTER — Ambulatory Visit: Payer: Medicare Other | Admitting: Physician Assistant

## 2014-01-26 ENCOUNTER — Ambulatory Visit (INDEPENDENT_AMBULATORY_CARE_PROVIDER_SITE_OTHER): Payer: Medicare Other | Admitting: Internal Medicine

## 2014-01-26 ENCOUNTER — Encounter: Payer: Self-pay | Admitting: Internal Medicine

## 2014-01-26 VITALS — BP 130/80 | HR 105 | Temp 97.6°F | Ht 66.0 in | Wt 235.5 lb

## 2014-01-26 DIAGNOSIS — N390 Urinary tract infection, site not specified: Secondary | ICD-10-CM | POA: Insufficient documentation

## 2014-01-26 DIAGNOSIS — E119 Type 2 diabetes mellitus without complications: Secondary | ICD-10-CM

## 2014-01-26 DIAGNOSIS — E785 Hyperlipidemia, unspecified: Secondary | ICD-10-CM

## 2014-01-26 DIAGNOSIS — I10 Essential (primary) hypertension: Secondary | ICD-10-CM

## 2014-01-26 DIAGNOSIS — Z23 Encounter for immunization: Secondary | ICD-10-CM

## 2014-01-26 DIAGNOSIS — I509 Heart failure, unspecified: Secondary | ICD-10-CM

## 2014-01-26 MED ORDER — OMEPRAZOLE 20 MG PO CPDR: 20.0000 mg | DELAYED_RELEASE_CAPSULE | Freq: Every day | ORAL | Status: AC

## 2014-01-26 MED ORDER — NEBIVOLOL HCL 10 MG PO TABS: 10.0000 mg | ORAL_TABLET | Freq: Every day | ORAL | Status: DC

## 2014-01-26 MED ORDER — POTASSIUM CHLORIDE CRYS ER 20 MEQ PO TBCR: 40.0000 meq | EXTENDED_RELEASE_TABLET | Freq: Every day | ORAL | Status: AC

## 2014-01-26 MED ORDER — FAMOTIDINE 20 MG PO TABS: 20.0000 mg | ORAL_TABLET | Freq: Every day | ORAL | Status: DC

## 2014-01-26 MED ORDER — INSULIN NPH ISOPHANE & REGULAR (70-30) 100 UNIT/ML ~~LOC~~ SUSP: SUBCUTANEOUS | Status: DC

## 2014-01-26 MED ORDER — FLUOXETINE HCL 40 MG PO CAPS: 40.0000 mg | ORAL_CAPSULE | Freq: Every morning | ORAL | Status: DC

## 2014-01-26 MED ORDER — CLONIDINE HCL 0.1 MG PO TABS: 0.1000 mg | ORAL_TABLET | Freq: Two times a day (BID) | ORAL | Status: AC

## 2014-01-26 NOTE — Progress Notes (Signed)
Pre visit review using our clinic review tool, if applicable. No additional management support is needed unless otherwise documented below in the visit note. 

## 2014-01-26 NOTE — Patient Instructions (Signed)
You had the new Prevnar pneumonia shot today  Ok to re-start the NPH insulin at 7 units per day (to take after you get up most days after 10 am)  Please continue all other medications as before, and refills have been done if requested. Please have the pharmacy call with any other refills you may need.  Please continue your efforts at being more active, low cholesterol diet, and weight control.  Please keep your appointments with your specialists as you have planned such as urology  Please return in 3 months, or sooner if needed

## 2014-01-26 NOTE — Progress Notes (Signed)
Subjective:    Patient ID: Sarah Kirk, female    DOB: 06-13-1933, 80 y.o.   MRN: [id number]  HPI  Here with daughter for f/u; overall doing ok,  Pt denies chest pain, increased sob or doe, wheezing, orthopnea, PND, increased LE swelling, palpitations, dizziness or syncope.  Pt denies polydipsia, polyuria, or low sugar symptoms such as weakness or confusion improved with po intake.  Pt denies new neurological symptoms such as new headache, or facial or extremity weakness or numbness.   Pt states overall good compliance with meds, has been trying to follow lower cholesterol, diabetic diet, with wt overall stable,  but little exercise however.  Has been off insulin after advised to do so after a1c = 4, but now with sugars in mid 100's or slightly higher most days (high of 419 one day recent), appetitie better, ? Gained a few lbs. Also -  Did have episode pna dec 2014, hospd with rehab after, had fallen about that time, now with rolling walker, post infxn with another pna/uti/dehydration.   Past Medical History  Diagnosis Date  . HBP (high blood pressure)   . CHF (congestive heart failure)   . HLD (hyperlipidemia)   . Morbid obesity     PFTs 07/14/08 moderate restriction vital capacity 58% with ERV 32% DLCO 38, corrects to 108  . Right heart enlargement     CT 07/16/08. nocturnal desat x 25 min 07/29/08  . Atrial fibrillation   . Diabetes mellitus without complication   . PONV (postoperative nausea and vomiting)   . Depression    Past Surgical History  Procedure Laterality Date  . Broken wrist      x2  . Cataract extraction Bilateral 2006  . Abdominal hysterectomy  1969    abd?  . Breast surgery  1964    cyst-non cancerous    reports that she quit smoking about 34 years ago. Her smoking use included Cigarettes. She has a 20 pack-year smoking history. She has never used smokeless tobacco. She reports that she does not drink alcohol or use illicit drugs. family history includes Breast  cancer in her daughter; Cancer in her maternal aunt; Depression in her father and mother; Heart disease in her brother, father, and mother; Ovarian cancer in her daughter; Stroke in her mother. Allergies  Allergen Reactions  . Tetracyclines & Related Other (See Comments)    Breaks her mouth out  . Macrobid [Nitrofurantoin]     Worsening lung fx  . Metformin     Doctor doesn't want pt to take because it will scar her lungs   Current Outpatient Prescriptions on File Prior to Visit  Medication Sig Dispense Refill  . ALPRAZolam (XANAX) 0.25 MG tablet Take 1 tablet (0.25 mg total) by mouth 2 (two) times daily as needed for sleep.  60 tablet  0  . aspirin 325 MG tablet Take 325 mg by mouth every morning.       . furosemide (LASIX) 40 MG tablet Take 1.5 tablets (60 mg total) by mouth 2 (two) times daily.  45 tablet  5  . HYDROcodone-acetaminophen (NORCO) 5-325 MG per tablet Take 1 tablet by mouth every 6 (six) hours as needed for pain.  45 tablet  0  . ipratropium-albuterol (DUONEB) 0.5-2.5 (3) MG/3ML SOLN Take 3 mLs by nebulization every 6 (six) hours as needed (wheezing/shortness of breath).      . metolazone (ZAROXOLYN) 5 MG tablet Take 0.5 tablets (2.5 mg total) by mouth every other day.  30 tablet  6   No current facility-administered medications on file prior to visit.    Review of Systems  Constitutional: Negative for unexpected weight change, or unusual diaphoresis  HENT: Negative for tinnitus.   Eyes: Negative for photophobia and visual disturbance.  Respiratory: Negative for choking and stridor.   Gastrointestinal: Negative for vomiting and blood in stool.  Genitourinary: Negative for hematuria and decreased urine volume.  Musculoskeletal: Negative for acute joint swelling Skin: Negative for color change and wound.  Neurological: Negative for tremors and numbness other than noted  Psychiatric/Behavioral: Negative for decreased concentration or  hyperactivity.       Objective:    Physical Exam BP 130/80  Pulse 105  Temp(Src) 97.6 F (36.4 C) (Oral)  Ht 5\' 6"  (1.676 m)  Wt 235 lb 8 oz (106.822 kg)  BMI 38.03 kg/m2  SpO2 80% VS noted, chronically ill appearing, nontoxic Constitutional: Pt appears well-developed and well-nourished.  HENT: Head: NCAT.  Right Ear: External ear normal.  Left Ear: External ear normal.  Eyes: Conjunctivae and EOM are normal. Pupils are equal, round, and reactive to light.  Neck: Normal range of motion. Neck supple.  Cardiovascular: Normal rate and regular rhythm.   Pulmonary/Chest: Effort normal and breath sounds decreased, no rales or wheezing.  Abd:  Soft, NT, non-distended, + BS Neurological: Pt is alert. Not confused  Skin: Skin is warm. No erythema.  Psychiatric: Pt behavior is normal. Thought content normal.     Assessment & Plan:

## 2014-01-27 NOTE — Assessment & Plan Note (Signed)
stable overall by history and exam, recent data reviewed with pt, and pt to continue medical treatment as before,  to f/u any worsening symptoms or concerns BP Readings from Last 3 Encounters:  01/26/14 130/80  12/15/13 112/70  08/20/13 114/80

## 2014-01-27 NOTE — Assessment & Plan Note (Signed)
stable overall by history and exam, recent data reviewed with pt, and pt to continue medical treatment as before,  to f/u any worsening symptoms or concerns Lab Results  Component Value Date   LDLCALC 66 12/15/2013   D/w pt and daugter - cont same tx, controlled

## 2014-01-27 NOTE — Assessment & Plan Note (Signed)
Mild uncontrolled by hx, even though recent a1c very low; ok to re-start 70/30 but at half dose, AM only (which is about 11am most days as she normally does not get up until then from bed, and no eating)

## 2014-01-27 NOTE — Assessment & Plan Note (Signed)
Stable volume today,cont same tx,  to f/u any worsening symptoms or concerns, also f/u with cardiology as planned

## 2014-02-09 ENCOUNTER — Telehealth: Payer: Self-pay

## 2014-02-09 NOTE — Telephone Encounter (Signed)
Relevant patient education assigned to patient using Emmi. ° °

## 2014-02-10 ENCOUNTER — Ambulatory Visit (INDEPENDENT_AMBULATORY_CARE_PROVIDER_SITE_OTHER): Payer: Medicare Other | Admitting: Internal Medicine

## 2014-02-10 ENCOUNTER — Encounter: Payer: Self-pay | Admitting: Internal Medicine

## 2014-02-10 ENCOUNTER — Ambulatory Visit (INDEPENDENT_AMBULATORY_CARE_PROVIDER_SITE_OTHER)
Admission: RE | Admit: 2014-02-10 | Discharge: 2014-02-10 | Disposition: A | Discharge status: Home / Self Care | Payer: Medicare Other | Source: Ambulatory Visit | Attending: Internal Medicine | Admitting: Internal Medicine

## 2014-02-10 VITALS — BP 120/74 | HR 89 | Temp 97.3°F | Ht 68.0 in | Wt 227.0 lb

## 2014-02-10 DIAGNOSIS — J9610 Chronic respiratory failure, unspecified whether with hypoxia or hypercapnia: Secondary | ICD-10-CM

## 2014-02-10 DIAGNOSIS — J8410 Pulmonary fibrosis, unspecified: Secondary | ICD-10-CM

## 2014-02-10 MED ORDER — ALBUTEROL SULFATE (2.5 MG/3ML) 0.083% IN NEBU: 2.5000 mg | INHALATION_SOLUTION | RESPIRATORY_TRACT | Status: DC | PRN

## 2014-02-10 NOTE — Progress Notes (Signed)
Quick Note:  LMTCB ______ 

## 2014-02-10 NOTE — Progress Notes (Signed)
Subjective:    Patient ID: Sarah Kirk, female    DOB: September 11, 1933 MRN: [id number]    Primary is [name]  [name]    Brief patient profile:  79 yowf quit smoking 1981 at wt < 170-200  eval in 2010 in pulmonary clinic with mostly restrictive changes c/w obesity.    History of Present Illness  Previous w/u: baseline = Nov 2008 dx with chf transiently better and fine able to walk outside up incline until Feb 09 gradual worsening to point of sob rm to rm no noct awakening just sob if she has to get out of bed to go to the bathroom. neb doesn't help much.  06/10/08 initial eval, copd vs ace, ace d/c'd   July 14, 2008 ov 100% better to her satisfaction after stopped ace, returns for PFT's, no limiiting sob, cough, cp. only finding was disproportionate reduction in diffusing capacity > no change rx  October 19, 2008 ov  Because of obesity I worried about occult thromboembolic disease and obtained a CT scan of her chest which was normal except for elevated right heart structures suggesting chronic pulmonary hypertension. This lead to overnight sleep oximetry showing 25 minutes of desaturation for which I recommended she be placed on oxygen at 2 L but she says she feels better when she sleeps without it.   rec noct 02 titrated to adequate sats Serial echo to follow ? PAH > to see [facility] .     12/10/2012 1st pulmonary ov in EPIC era with wt drop from 272  Down to 190  Early in 2013  With no problem  Breathing and no need for 02 then started downhill since Feb 2013 with steady wt gain  Since then fell June 2013    fell broke 2 ribs >  now sob room to room, placed on 02 prn since a year ago. No better on neb. rec bystolic 10 mg twice daily in place of atenolol Only use nebulizer if you feel it really helps you Wear 02 2lpm 24 hours per day for  Now     .01/28/2013 f/u ov/Sarah Kirk f/u sob ? Etiology Chief Complaint   Patient presents with  . Acute Visit    Breathing worse for the past 2 wks, gets out of breath with any exertion at all, such as standing to weigh on the scale today.   sleeps on side on 02 ok horizontal and no increase in chronic leg swelling or cp of any kind. Sob is moderate and comes on just with exertion, not at rest, not clear using 02 as directed - not clear whether may have received macrodantin recently for uti (daughter thinks so but unclear on specifics) bystolic 10 mg daily instead of tenormin until you come back Try nebulizer when your breathing is bad at rest or you know a certain  activity is going to make you short of breath to see if it helps.  Try prilosec 20mg   Take 30-60 min before first meal of the day and Pepcid 20 mg one bedtime  GERD diet     02/12/13 Follow up and med calendar  Returns for follow up and med review  We reviewed all her meds and organized them into a med calendar with pt education .  Finished prednisone yesterday.  Hospital records reviewed from [place].  Recently admitted to [facility] [address] for new onset Atrial fib w/ CHF. COPD flare  Atrial fib was tx w /  rate control , no anticoagulation.  CT chest showed mild chronic interstitial lung disease ? NSIP  She was tx w/ IV steroids and diureis. No discharge summary avail  She is feeling better but still weak.  Denies chest pain, orthopnea, syncope or fever.  Wt is down 13 lbs , edema is less rec Follow med calendar closely and bring to each visit.  Return in 2 weeks for PFT and office visit with Dr. Sherene SiresWert   We are referring you to PCP within [place] .   02/23/13 [facility] with dehydration correlating with complete resolution of peripheral edema   03/24/2013 f/u ov/Sarah Kirk 02 24/7 2 at rest and 4 lpm x walkng Chief Complaint  Patient presents with  . Follow-up    Breathing is unchanged since the last visit   sob if not wearing 02 or if walking s increasing to 4lpm.   rec 02 2lpm at rest and 4lpm with  activity Wear elastic stockings (TEDS) and elevate legs as much as possible  Ok to hold furosemide completely and the metazolone if leg swelling is completely gone For now take furosemide one daily and metazolone one every other day For excess swelling take one extra furosemide daily  See Brayton MarsBatey next and let her manage your diuretics    07/09/2013 f/u ov/Sarah Kirk re: 02 dep resp failure  Chief Complaint  Patient presents with  . Follow-up    Pt c/o increased SOB for the past 2 months-relates to anxiety. She is using her o2 all of the time now.    no cough. Using 02 2lpm at rest Uses gets up and walks one end of house to the other on 4lpm Uses scooter to shop. rec 02 2lpm at rest and 4lpm with activity but you must walk at a much slower pace     08/20/2013 f/u ov/Sarah Kirk re: PF/ 02 dep c/w macrodantin Chief Complaint  Patient presents with  . Follow-up    [name]- prod with large amounts of yellow sputum.  Has used neb once in the past wk.    more nasal congestion with cough but not worse breathing Using lots of peppermint for cough  rec Prevnar today and depomedrol 120 today Oxycodone 5 mg 1-2 one every 4 hours as needed for cough or pain  Aleve take with meals twice daily until pain is gone Omeprazole 20 mg Take 30- 60 min before your first and last meals of the day  GERD diet   02/10/2014 f/u ov/Sarah Kirk re: PF / 02 dep resp failure @  4 lpm  Chief Complaint  Patient presents with  . Follow-up    Pt states that she was hospitalized back in Jan 2015 for PNA. She states that her breathing has been worse for the past couple of months. She gets SOB walking across the room. Has good days and bad days.   continues to receive rx for macrodantin but daughter says never took it again after we rec it be avoided    No obvious pattern to  daytime variabilty or assoc cough   cp or chest  tightness, subjective wheeze overt sinus or hb symptoms. No unusual exp hx or h/o childhood pna/ asthma or premature birth to her knowledge.   Sleeping ok without nocturnal  or early am exacerbation  of respiratory  c/o's or need for noct saba. Also denies any obvious fluctuation  of symptoms with weather or environmental changes or other aggravating or alleviating factors except as outlined above   Current Medications, Allergies, Past Medical History, Past Surgical History, Family History, and Social History were reviewed in [facility] electronic medical record.  ROS  The following are not active complaints unless bolded sore throat, dysphagia, dental problems, itching, sneezing,  nasal congestion or excess/ purulent secretions, ear ache,   fever, chills, sweats, unintended wt loss, pleuritic or exertional cp, hemoptysis,  orthopnea pnd or leg swelling, presyncope, palpitations, heartburn, abdominal pain, anorexia, nausea, vomiting, diarrhea  or change in bowel or urinary habits, change in stools or urine, dysuria,hematuria,  rash, arthralgias, visual complaints, headache, numbness weakness or ataxia or problems with walking or coordination,  change in mood/affect or memory.          Past Medical History:  HBP CHF hyperlipidemia  Morbid Obesity  - PFTs 07/14/08 moderate restriction vital capacity 58% with ERV 32% DLCO 38, corrects to 108  Right Heart Enlargment  - see CT 07/16/08  - Nocturnal desat x 25 min 07/29/08   Family History:  heart disease-both parents and siblings  rheumatism-mother  cancer- aunt on mothers side  neg resp dz/atopy   Social History:   lives with daughter, sandy and brother  retired  occupation- Chief of Staffcashier  Horse  dog  Lots of cats  quit smoking in 1981         Objective:   Physical Exam   amb obese wf nad in w/c on 02 at 4lpm   02/10/2014       227  Wt Readings from Last 3 Encounters:  08/20/13 244 lb (110.678 kg)  07/09/13 239 lb (108.41  kg)  07/09/13 239 lb (108.41 kg)           HEENT: nl dentition, turbinates, and orophanx. Nl external ear canals without cough reflex   NECK :  without JVD/Nodes/TM/ nl carotid upstrokes bilaterally   LUNGS: no acc muscle use, distant bs, min bilateral basilar crackles without cough on insp or exp maneuvers   CV:  RRR   no S3 or murmur or increase in P2,  2+ bil lower ext edema   ABD:  soft and nontender with limited excursion with inspiration. No bruits or organomegaly, bowel sounds nl  MS:  warm without deformities, calf tenderness, cyanosis or clubbing  SKIN: warm and dry without lesions        CT chest 02/06/13  Suspected mild chronic interstitial lung disease, possibly>nonspecific interstitial pneumonitis (NSIP), progressed from 2011. Small mediastinal lymph nodes, as described above, likely reactive No evidence of acute cardiopulmonary disease.  02/10/2014 cxr Diffuse pulmonary interstitial lung disease, pulmonary interstitial fibrosis could present in this fashion.           Assessment & Plan:

## 2014-02-10 NOTE — Patient Instructions (Addendum)
Please remember to go to the   x-ray department downstairs for your tests - we will call you with the results when they are available.  Stop duoneb and just use albuterol 2.5 mg every 4 hours as needed for breathing   Please schedule a follow up visit in 3 months but call sooner if needed

## 2014-02-11 NOTE — Progress Notes (Signed)
Quick Note:  Spoke with pt and notified of results per Dr. Wert. Pt verbalized understanding and denied any questions.  ______ 

## 2014-02-12 NOTE — Assessment & Plan Note (Addendum)
-  last macrodantin exposure 03/08/13 x 10 d - PFTs 03/24/2013 vc 1.68 (52%) no obstruction with dlco 24% corrects to 49%  - ESR 25  07/09/13   DDx for pulmonary fibrosis  includes idiopathic pulmonary fibrosis, pulmonary fibrosis associated with rheumatologic diseases (which have a relatively benign course in most cases) , adverse effect from  drugs such as chemotherapy or amiodarone exposure, nonspecific interstitial pneumonia which is typically steroid responsive, and chronic hypersensitivity pneumonitis.   In active  smokers Langerhan's Cell  Histiocyctosis (eosinophilic granuomatosis),  DIP,  and Respiratory Bronchiolitis ILD also need to be considered,    For now, Use of PPI is associated with improved survival time and with decreased radiologic fibrosis per King's study published in [facility] vol 184 p1390.  Dec 2011  This may not be cause and effect, but given how universally unhelpful all the other study drugs have been for pf,   rec continue  rx ppi / diet/ lifestyle modification.    No need for duoneb as this is not any form of chronic airflow obst problem and ok to use alb q 4 h prn if she thinks it helps but I'm doubtful there is much asthma here

## 2014-02-12 NOTE — Assessment & Plan Note (Signed)
-   Resting sats on 2lpm 07/09/2013 =  96% vs walking on 4lpm = desat x 100 ft walking at a near nl pace - resting sats 87% RA    As of 02/10/14  02 2lpm at rest and 4lpm with exertion.

## 2014-04-27 ENCOUNTER — Ambulatory Visit: Payer: Medicare Other | Admitting: Internal Medicine

## 2014-05-11 ENCOUNTER — Ambulatory Visit (INDEPENDENT_AMBULATORY_CARE_PROVIDER_SITE_OTHER)
Admission: RE | Admit: 2014-05-11 | Discharge: 2014-05-11 | Disposition: A | Discharge status: Home / Self Care | Payer: Medicare Other | Source: Ambulatory Visit | Attending: Internal Medicine | Admitting: Internal Medicine

## 2014-05-11 ENCOUNTER — Encounter: Payer: Self-pay | Admitting: Internal Medicine

## 2014-05-11 ENCOUNTER — Ambulatory Visit (INDEPENDENT_AMBULATORY_CARE_PROVIDER_SITE_OTHER): Payer: Medicare Other | Admitting: Internal Medicine

## 2014-05-11 VITALS — BP 140/80 | HR 95 | Temp 98.5°F | Wt 220.0 lb

## 2014-05-11 DIAGNOSIS — R69 Illness, unspecified: Secondary | ICD-10-CM

## 2014-05-11 DIAGNOSIS — I10 Essential (primary) hypertension: Secondary | ICD-10-CM

## 2014-05-11 DIAGNOSIS — R0600 Dyspnea, unspecified: Secondary | ICD-10-CM

## 2014-05-11 DIAGNOSIS — F411 Generalized anxiety disorder: Secondary | ICD-10-CM

## 2014-05-11 DIAGNOSIS — I509 Heart failure, unspecified: Secondary | ICD-10-CM

## 2014-05-11 DIAGNOSIS — R0989 Other specified symptoms and signs involving the circulatory and respiratory systems: Secondary | ICD-10-CM

## 2014-05-11 DIAGNOSIS — J9610 Chronic respiratory failure, unspecified whether with hypoxia or hypercapnia: Secondary | ICD-10-CM

## 2014-05-11 DIAGNOSIS — R0609 Other forms of dyspnea: Secondary | ICD-10-CM

## 2014-05-11 DIAGNOSIS — Z7409 Other reduced mobility: Secondary | ICD-10-CM

## 2014-05-11 MED ORDER — ALPRAZOLAM 0.25 MG PO TABS: 0.2500 mg | ORAL_TABLET | Freq: Two times a day (BID) | ORAL | Status: DC | PRN

## 2014-05-11 NOTE — Progress Notes (Signed)
Pre visit review using our clinic review tool, if applicable. No additional management support is needed unless otherwise documented below in the visit note. 

## 2014-05-11 NOTE — Patient Instructions (Signed)
Please continue all other medications as before, and refills have been done if requested.  Please have the pharmacy call with any other refills you may need.  Please continue your efforts at being more active, low cholesterol diet, and weight control.  Please keep your appointments with your specialists as you may have planned  Please go to the XRAY Department in the Basement (go straight as you get off the elevator) for the x-ray testing  You will be contacted by phone if any changes need to be made immediately.  Otherwise, you will receive a letter about your results with an explanation, but please check with MyChart first.  Please remember to sign up for MyChart if you have not done so, as this will be important to you in the future with finding out test results, communicating by private email, and scheduling acute appointments online when needed.  Please return in  3 months, or sooner if needed

## 2014-05-11 NOTE — Progress Notes (Addendum)
Subjective:    Patient ID: Sarah BirchwoodHelen Cranfield, female    DOB: Aug 27, 1933, 80 y.o.   MRN: [id number]  HPI  Here to f/u with daughter concerned about volume overload, Daughter concerned about pt wt gain by her scale at home from 202 up now 18 lbs, with worsening edema. Wt documented here shows 15 lbs wt loss from last visit.Pt denies chest pain, increased sob or doe, wheezing, orthopnea, PND, increased LE swelling, palpitations, dizziness or syncope, though daughter convinced she has worsened edema to legs.  Urologist mentions pessary with her dropped bladder, with worsening dialy almost constant incontinence, daughter wondering if some sort of bladder blockage.    Denies worsening depressive symptoms, suicidal ideation, or panic; has ongoing anxiety, not increased recently. Needs xanax refill.   Also here for facetoface for evaluation for new power wheelchair,  Pt has cont'd severe sob/doe due to chronic respiratory failure due to pulmonary fibrosis, and CHF that severely limits ambulatory ability. Medications have been optimized including diuretic as listed.  Exam has slowly progressed in the past 1-2 yrs.   Chronic respiratory failure, chronic diastolic heart failure, Hypertension, overall general weakness, all of which limits mobility as well.   Pt can only walk less than 5 ft slowly with assist, and requires assist with transfers as well. ADL's severely impaired, with grooming, bathing, moving from room to room, dressing and toileting, though can feed.  Unable to be assisted with cane or walker due to co-morbids, and general weakness, risk of fall..  No recent falls, but remains at high risk, and will not improve.  Pt unable to use manual wheelchair due to general weakness upper extremities due to respiratory failure.  Pt home is capable of supporting use of chair, and pt able and willing to do so. Pt unable to use Power scooter due to need for leg elevation.   Past Medical History  Diagnosis Date    . HBP (high blood pressure)   . CHF (congestive heart failure)   . HLD (hyperlipidemia)   . Morbid obesity     PFTs 07/14/08 moderate restriction vital capacity 58% with ERV 32% DLCO 38, corrects to 108  . Right heart enlargement     CT 07/16/08. nocturnal desat x 25 min 07/29/08  . Atrial fibrillation   . Diabetes mellitus without complication   . PONV (postoperative nausea and vomiting)   . Depression    Past Surgical History  Procedure Laterality Date  . Broken wrist      x2  . Cataract extraction Bilateral 2006  . Abdominal hysterectomy  1969    abd?  . Breast surgery  1964    cyst-non cancerous    reports that she quit smoking about 34 years ago. Her smoking use included Cigarettes. She has a 20 pack-year smoking history. She has never used smokeless tobacco. She reports that she does not drink alcohol or use illicit drugs. family history includes Breast cancer in her daughter; Cancer in her maternal aunt; Depression in her father and mother; Heart disease in her brother, father, and mother; Ovarian cancer in her daughter; Stroke in her mother. Allergies  Allergen Reactions  . Tetracyclines & Related Other (See Comments)    Breaks her mouth out  . Macrobid [Nitrofurantoin]     Worsening lung fx  . Metformin     Doctor doesn't want pt to take because it will scar her lungs   Current Outpatient Prescriptions on File Prior to Visit  Medication Sig Dispense Refill  .  albuterol (PROVENTIL) (2.5 MG/3ML) 0.083% nebulizer solution Take 3 mLs (2.5 mg total) by nebulization every 4 (four) hours as needed for wheezing or shortness of breath.  75 mL  12  . aspirin 325 MG tablet Take 325 mg by mouth every morning.       . cloNIDine (CATAPRES) 0.1 MG tablet Take 1 tablet (0.1 mg total) by mouth 2 (two) times daily.  60 tablet  11  . famotidine (PEPCID) 20 MG tablet Take 1 tablet (20 mg total) by mouth at bedtime.  30 tablet  11  . FLUoxetine (PROZAC) 40 MG capsule Take 1 capsule (40 mg  total) by mouth every morning.  30 capsule  11  . furosemide (LASIX) 40 MG tablet Take 40 mg by mouth 2 (two) times daily.      Marland Kitchen HYDROcodone-acetaminophen (NORCO) 5-325 MG per tablet Take 1 tablet by mouth every 6 (six) hours as needed for pain.  45 tablet  0  . insulin NPH-regular Human (NOVOLIN 70/30) (70-30) 100 UNIT/ML injection 7 units sq per day  10 mL  5  . nebivolol (BYSTOLIC) 10 MG tablet Take 1 tablet (10 mg total) by mouth daily. Lot # [id number] exp 10/16  30 tablet  11  . omeprazole (PRILOSEC) 20 MG capsule Take 1 capsule (20 mg total) by mouth daily before breakfast.  30 capsule  11  . potassium chloride SA (K-DUR,KLOR-CON) 20 MEQ tablet Take 2 tablets (40 mEq total) by mouth daily.  60 tablet  11   No current facility-administered medications on file prior to visit.   Review of Systems  Constitutional: Negative for unusual diaphoresis or other sweats  HENT: Negative for ringing in ear Eyes: Negative for double vision or worsening visual disturbance.  Respiratory: Negative for choking and stridor.   Gastrointestinal: Negative for vomiting or other signifcant bowel change Genitourinary: Negative for hematuria or decreased urine volume.  Musculoskeletal: Negative for other MSK pain or swelling Skin: Negative for color change and worsening wound.  Neurological: Negative for tremors and numbness other than noted  Psychiatric/Behavioral: Negative for decreased concentration or agitation other than above       Objective:   Physical Exam BP 140/80  Pulse 95  Temp(Src) 98.5 F (36.9 C) (Oral)  Wt 220 lb (99.791 kg) VS noted,  Constitutional: Pt appears well-developed, well-nourished.  HENT: Head: NCAT.  Right Ear: External ear normal.  Left Ear: External ear normal.  Eyes: . Pupils are equal, round, and reactive to light. Conjunctivae and EOM are normal Neck: Normal range of motion. Neck supple.  Cardiovascular: Normal rate and regular rhythm.   Pulmonary/Chest: Effort  normal and breath sounds without wheezing, does have bilat basilar rales - ? chronic.  Abd:  Soft, NT, ND, + BS Neurological: Pt is alert. Not confused ,ROM all joints adequate and motor grossly intact but marked/mod to severe general weakness 3+/5 observed, gait, balance, coordination not tested due to weakness, risk of fall Skin: Skin is warm. No rash, No LE edema currently Psychiatric: Pt behavior is normal. No agitation.     Assessment & Plan:

## 2014-05-14 NOTE — Assessment & Plan Note (Signed)
stable overall by history and exam, recent data reviewed with pt, and pt to continue medical treatment as before,  to f/u any worsening symptoms or concerns BP Readings from Last 3 Encounters:  05/11/14 140/80  02/10/14 120/74  01/26/14 130/80

## 2014-05-14 NOTE — Assessment & Plan Note (Addendum)
I see little to no evidence for volume increase by wt or exam, for cxr to help assess but hold for now on diuretic change, daughter concerned this is not adequate  Note:  Total time for pt hx, exam, review of record with pt in the room, determination of diagnoses and plan for further eval and tx is > 40 min, with over 50% spent in coordination and counseling of patient

## 2014-05-14 NOTE — Assessment & Plan Note (Signed)
stable overall by history and exam, recent data reviewed with pt, and pt to continue medical treatment as before,  to f/u any worsening symptoms or concerns Lab Results  Component Value Date   WBC 11.6* 12/15/2013   HGB 12.5 12/15/2013   HCT 37.8 12/15/2013   PLT 315.0 12/15/2013   GLUCOSE 52* 12/15/2013   CHOL 136 12/15/2013   TRIG 123.0 12/15/2013   HDL 45.00 12/15/2013   LDLCALC 66 12/15/2013   ALT 16 12/15/2013   AST 23 12/15/2013   NA 135 12/15/2013   K 3.6 12/15/2013   CL 96 12/15/2013   CREATININE 1.1 12/15/2013   BUN 7 12/15/2013   CO2 32 12/15/2013   TSH 2.65 07/09/2013   HGBA1C 4.0 12/15/2013   MICROALBUR 0.3 07/09/2013

## 2014-05-14 NOTE — Assessment & Plan Note (Signed)
stable overall by history and exam, recent data reviewed with pt, and pt to continue medical treatment as before,  to f/u any worsening symptoms or concerns SpO2 Readings from Last 3 Encounters:  02/10/14 87%  01/26/14 80%  12/15/13 95%

## 2014-05-15 ENCOUNTER — Encounter: Payer: Self-pay | Admitting: Internal Medicine

## 2014-05-15 DIAGNOSIS — Z7409 Other reduced mobility: Secondary | ICD-10-CM | POA: Insufficient documentation

## 2014-05-15 DIAGNOSIS — I4891 Unspecified atrial fibrillation: Secondary | ICD-10-CM | POA: Insufficient documentation

## 2014-05-15 NOTE — Addendum Note (Signed)
Addended by: Corwin LevinsJOHN, JAMES W on: 05/15/2014 09:57 AM   Modules accepted: Level of Service

## 2014-05-15 NOTE — Assessment & Plan Note (Signed)
Forms filled out for new power wheelchair

## 2014-08-11 ENCOUNTER — Ambulatory Visit: Payer: Medicare Other | Admitting: Internal Medicine

## 2014-08-11 DIAGNOSIS — Z0289 Encounter for other administrative examinations: Secondary | ICD-10-CM

## 2014-09-02 ENCOUNTER — Ambulatory Visit (INDEPENDENT_AMBULATORY_CARE_PROVIDER_SITE_OTHER): Payer: Medicare Other | Admitting: Internal Medicine

## 2014-09-02 ENCOUNTER — Other Ambulatory Visit (INDEPENDENT_AMBULATORY_CARE_PROVIDER_SITE_OTHER): Payer: Medicare Other

## 2014-09-02 ENCOUNTER — Encounter: Payer: Self-pay | Admitting: Internal Medicine

## 2014-09-02 ENCOUNTER — Ambulatory Visit (INDEPENDENT_AMBULATORY_CARE_PROVIDER_SITE_OTHER)
Admission: RE | Admit: 2014-09-02 | Discharge: 2014-09-02 | Disposition: A | Discharge status: Home / Self Care | Payer: Medicare Other | Source: Ambulatory Visit | Attending: Internal Medicine | Admitting: Internal Medicine

## 2014-09-02 VITALS — BP 98/64 | HR 81 | Temp 97.4°F | Wt 212.4 lb

## 2014-09-02 VITALS — BP 98/64 | HR 84 | Ht 68.0 in | Wt 212.0 lb

## 2014-09-02 DIAGNOSIS — E119 Type 2 diabetes mellitus without complications: Secondary | ICD-10-CM

## 2014-09-02 DIAGNOSIS — I5030 Unspecified diastolic (congestive) heart failure: Secondary | ICD-10-CM

## 2014-09-02 DIAGNOSIS — E785 Hyperlipidemia, unspecified: Secondary | ICD-10-CM

## 2014-09-02 DIAGNOSIS — J8410 Pulmonary fibrosis, unspecified: Secondary | ICD-10-CM

## 2014-09-02 DIAGNOSIS — I10 Essential (primary) hypertension: Secondary | ICD-10-CM

## 2014-09-02 DIAGNOSIS — R0600 Dyspnea, unspecified: Secondary | ICD-10-CM | POA: Insufficient documentation

## 2014-09-02 DIAGNOSIS — F329 Major depressive disorder, single episode, unspecified: Secondary | ICD-10-CM

## 2014-09-02 DIAGNOSIS — J9612 Chronic respiratory failure with hypercapnia: Secondary | ICD-10-CM

## 2014-09-02 DIAGNOSIS — F32A Depression, unspecified: Secondary | ICD-10-CM

## 2014-09-02 LAB — CBC WITH DIFFERENTIAL/PLATELET
Basophils Absolute: 0.1 10*3/uL (ref 0.0–0.1)
Basophils Relative: 0.6 % (ref 0.0–3.0)
EOS PCT: 1.6 % (ref 0.0–5.0)
Eosinophils Absolute: 0.2 10*3/uL (ref 0.0–0.7)
HEMATOCRIT: 39.2 % (ref 36.0–46.0)
Hemoglobin: 12.9 g/dL (ref 12.0–15.0)
LYMPHS ABS: 1.6 10*3/uL (ref 0.7–4.0)
Lymphocytes Relative: 14.5 % (ref 12.0–46.0)
MCHC: 32.9 g/dL (ref 30.0–36.0)
MCV: 88.4 fl (ref 78.0–100.0)
MONO ABS: 0.9 10*3/uL (ref 0.1–1.0)
MONOS PCT: 8.4 % (ref 3.0–12.0)
Neutro Abs: 8.2 10*3/uL — ABNORMAL HIGH (ref 1.4–7.7)
Neutrophils Relative %: 74.9 % (ref 43.0–77.0)
Platelets: 307 10*3/uL (ref 150.0–400.0)
RBC: 4.44 Mil/uL (ref 3.87–5.11)
RDW: 16.3 % — ABNORMAL HIGH (ref 11.5–15.5)
WBC: 11 10*3/uL — AB (ref 4.0–10.5)

## 2014-09-02 LAB — BASIC METABOLIC PANEL
BUN: 8 mg/dL (ref 6–23)
CHLORIDE: 96 meq/L (ref 96–112)
CO2: 32 mEq/L (ref 19–32)
Calcium: 8.9 mg/dL (ref 8.4–10.5)
Creatinine, Ser: 1 mg/dL (ref 0.4–1.2)
GFR: 59.58 mL/min — AB (ref >60.00)
Glucose, Bld: 73 mg/dL (ref 70–99)
Potassium: 3.4 mEq/L — ABNORMAL LOW (ref 3.5–5.1)
SODIUM: 138 meq/L (ref 135–145)

## 2014-09-02 LAB — SEDIMENTATION RATE: Sed Rate: 11 mm/hr (ref 0–22)

## 2014-09-02 LAB — BRAIN NATRIURETIC PEPTIDE: Pro B Natriuretic peptide (BNP): 591 pg/mL — ABNORMAL HIGH (ref 0.0–100.0)

## 2014-09-02 LAB — TSH: TSH: 2 u[IU]/mL (ref 0.35–4.50)

## 2014-09-02 MED ORDER — PREDNISONE 10 MG PO TABS: ORAL_TABLET | ORAL | Status: DC

## 2014-09-02 MED ORDER — BUPROPION HCL ER (SR) 100 MG PO TB12: 100.0000 mg | ORAL_TABLET | Freq: Two times a day (BID) | ORAL | Status: DC

## 2014-09-02 MED ORDER — ALBUTEROL SULFATE (2.5 MG/3ML) 0.083% IN NEBU: 2.5000 mg | INHALATION_SOLUTION | RESPIRATORY_TRACT | Status: AC | PRN

## 2014-09-02 NOTE — Assessment & Plan Note (Signed)
stable overall by history and exam, recent data reviewed with pt, and pt to continue medical treatment as before,  to f/u any worsening symptoms or concerns BP Readings from Last 3 Encounters:  09/02/14 98/64  09/02/14 98/64  05/11/14 140/80

## 2014-09-02 NOTE — Progress Notes (Signed)
Subjective:    Patient ID: Sarah BirchwoodHelen Kirk, female    DOB: 01-12-1933 MRN: [id number]    Primary is Kirk / Sarah [facility] [place] [facility]  [facility]    Brief patient profile:  [age]  yowf quit smoking 1981 at wt < 170-200  eval in 2010 in pulmonary clinic with mostly restrictive changes c/w obesity with concern also re Macodantin induced pulmonary fibrosis   History of Present Illness  Previous w/u: baseline = Nov 2008 dx with chf transiently better and fine able to walk outside up incline until Feb2009 gradual worsening to point of sob rm to rm no noct awakening just sob if she has to get out of bed to go to the bathroom. neb doesn't help much.  06/10/08 initial eval, copd vs ace, ace d/c'd   July 14, 2008 ov 100% better to her satisfaction after stopped ace, returns for PFT's, no limiiting sob, cough, cp. only finding was disproportionate reduction in diffusing capacity > no change rx  October 19, 2008 ov  Because of obesity I worried about occult thromboembolic disease and obtained a CT scan of her chest which was normal except for elevated right heart structures suggesting chronic pulmonary hypertension. This lead to overnight sleep oximetry showing 25 minutes of desaturation for which I recommended she be placed on oxygen at 2 L but she says she feels better when she sleeps without it.   rec noct 02 titrated to adequate sats Serial echo to follow ? PAH > to see [facility] .     09/02/2014 f/u ov/Chelsye Suhre re: chronic resp failure/ PF/ no med calendar  Chief Complaint  Patient presents with  . Follow-up    Pt states having increased SOB for the past month. She gets SOB with just walking a few steps.   always on  3lpm but 4lpm with activity    New change x one month is more cough/ congestion ? Better on neb until ran out and "dme would not fill it" No purulent sputum, cp. Comfortable at rest but doe x across the room even on 4lpm    No obvious pattern to  daytime variabilty or assoc p or chest tightness, subjective wheeze overt sinus or hb symptoms. No unusual exp hx or h/o childhood pna/ asthma or premature birth to her knowledge.   Sleeping ok without nocturnal  or early am exacerbation  of respiratory  c/o's or need for noct saba. Also denies any obvious fluctuation of symptoms with weather or environmental changes or other aggravating or alleviating factors except as outlined above   Current Medications, Allergies, Past Medical History, Past Surgical History, Family History, and Social History were reviewed in [facility] electronic medical record.  ROS  The following are not active complaints unless bolded sore throat, dysphagia, dental problems, itching, sneezing,  nasal congestion or excess/ purulent secretions, ear ache,   fever, chills, sweats, unintended wt loss, pleuritic or exertional cp, hemoptysis,  orthopnea pnd or leg swelling, presyncope, palpitations, heartburn, abdominal pain, anorexia, nausea, vomiting, diarrhea  or change in bowel or urinary habits, change in stools or urine, dysuria,hematuria,  rash, arthralgias, visual complaints, headache, numbness weakness or ataxia or problems with walking or coordination,  change in mood/affect or memory.          Past Medical History:  HBP CHF hyperlipidemia  Morbid Obesity  - PFTs 07/14/08 moderate restriction vital capacity 58% with ERV 32% DLCO 38, corrects to 108  Right Heart Enlargment  -  see CT 07/16/08  - Nocturnal desat x 25 min 07/29/08   Family History:  heart disease-both parents and siblings  rheumatism-mother  cancer- aunt on mothers side  neg resp dz/atopy   Social History:   lives with daughter, sandy and brother  retired  occupation- Chief of Staffcashier  Horse  dog  Lots of cats  quit smoking in 1981         Objective:   Physical Exam   amb obese wf nad in w/c on 02 at 4lpm   02/10/2014       227  >  09/02/2014  212  Wt  Readings from Last 3 Encounters:  08/20/13 244 lb (110.678 kg)  07/09/13 239 lb (108.41 kg)  07/09/13 239 lb (108.41 kg)           HEENT: nl dentition, turbinates, and orophanx. Nl external ear canals without cough reflex   NECK :  without JVD/Nodes/TM/ nl carotid upstrokes bilaterally   LUNGS: no acc muscle use, distant bs, min bilateral basilar crackles without cough on insp or exp maneuvers   CV:  RRR   no S3 or murmur or increase in P2,  1+ bil lower ext edema   ABD:  soft and nontender with limited excursion with inspiration. No bruits or organomegaly, bowel sounds nl  MS:  warm without deformities, calf tenderness, cyanosis or clubbing  SKIN: warm and dry without lesions        CT chest 02/06/13  Suspected mild chronic interstitial lung disease, possibly>nonspecific interstitial pneumonitis (NSIP), progressed from 2011. Small mediastinal lymph nodes, as described above, likely reactive No evidence of acute cardiopulmonary disease.    CXR  09/02/2014 :  cardiomegaly with ILD / poor aeration bilaterally    Recent Labs Lab 09/02/14 1554  NA 138  K 3.4*  CL 96  CO2 32  BUN 8  CREATININE 1.0  GLUCOSE 73    Recent Labs Lab 09/02/14 1554  HGB 12.9  HCT 39.2  WBC 11.0*  PLT 307.0     Lab Results  Component Value Date   TSH 2.00 09/02/2014     Lab Results  Component Value Date   PROBNP 591.0* 09/02/2014     Lab Results  Component Value Date   ESRSEDRATE 11 09/02/2014                Assessment & Plan:

## 2014-09-02 NOTE — Assessment & Plan Note (Signed)
stable overall by history and exam, recent data reviewed with pt, and pt to continue medical treatment as before,  to f/u any worsening symptoms or concerns Lab Results  Component Value Date   LDLCALC 66 12/15/2013

## 2014-09-02 NOTE — Progress Notes (Signed)
Pre visit review using our clinic review tool, if applicable. No additional management support is needed unless otherwise documented below in the visit note. 

## 2014-09-02 NOTE — Assessment & Plan Note (Addendum)
-  last macrodantin exposure 03/08/13 x 10 d - PFTs 03/24/2013 vc 1.68 (52%) no obstruction with dlco 24% corrects to 49%  - ESR 25  07/09/13   Not clear this is any worse and now ? Component of chf ? Hard to sort out   Her main new c/o = cough/ congestion with some symptomatic improvement from saba so ok to take prn and rx with mucinex dm/ very short course of prednisone to see what difference if any this makes  Very little else to offer here.

## 2014-09-02 NOTE — Assessment & Plan Note (Signed)
Ok to add wellbutrin 100 qd - declines counseling

## 2014-09-02 NOTE — Assessment & Plan Note (Addendum)
-    [place]    - last Echo 02/24/13 moderate concentric hypertrophy. Systolic function was normal. The estimated ejection fraction was in the range of 55% to 60%. Although no diagnostic regional wall motion abnormality was identified, this possibility cannot be completely excluded on the basis of this study. The study is not technically sufficient to allow evaluation of LV diastolic function. - Aortic valve: Trivial regurgitation. - Left atrium: The atrium was severely dilated. - Right atrium: The atrium was severely dilated. - Pulmonary arteries: PA peak pressure: 32mm Hg   bnp up a bit but seems well compensated on present rx and risks dehydration with more aggressive rx

## 2014-09-02 NOTE — Progress Notes (Signed)
Subjective:    Patient ID: Sarah BirchwoodHelen Kirk, female    DOB: 19-Sep-1933, 80 y.o.   MRN: [id number]  HPI  Here to f/u; overall doing ok,  Pt denies chest pain, increased sob or doe, wheezing, orthopnea, PND, increased LE swelling, palpitations, dizziness or syncope.  Pt denies polydipsia, polyuria, or low sugar symptoms such as weakness or confusion improved with po intake.  Pt denies new neurological symptoms such as new headache, or facial or extremity weakness or numbness.   Pt states overall good compliance with meds, has been trying to follow lower cholesterol, diabetic diet, with wt overall stable,  but little exercise however.  Has had mild worsening depressive symptoms, no suicidal ideation, or panic; has ongoing anxiety, mild increased recently.  Sees urology with chornic UTI. Also seen per Dr Sherene SiresWert earlier today Past Medical History  Diagnosis Date  . HBP (high blood pressure)   . CHF (congestive heart failure)   . HLD (hyperlipidemia)   . Morbid obesity     PFTs 07/14/08 moderate restriction vital capacity 58% with ERV 32% DLCO 38, corrects to 108  . Right heart enlargement     CT 07/16/08. nocturnal desat x 25 min 07/29/08  . Atrial fibrillation   . Diabetes mellitus without complication   . PONV (postoperative nausea and vomiting)   . Depression    Past Surgical History  Procedure Laterality Date  . Broken wrist      x2  . Cataract extraction Bilateral 2006  . Abdominal hysterectomy  1969    abd?  . Breast surgery  1964    cyst-non cancerous    reports that she quit smoking about 34 years ago. Her smoking use included Cigarettes. She has a 20 pack-year smoking history. She has never used smokeless tobacco. She reports that she does not drink alcohol or use illicit drugs. family history includes Breast cancer in her daughter; Cancer in her maternal aunt; Depression in her father and mother; Heart disease in her brother, father, and mother; Ovarian cancer in her daughter; Stroke  in her mother. Allergies  Allergen Reactions  . Tetracyclines & Related Other (See Comments)    Breaks her mouth out  . Macrobid [Nitrofurantoin]     Worsening lung fx  . Metformin     Doctor doesn't want pt to take because it will scar her lungs   Current Outpatient Prescriptions on File Prior to Visit  Medication Sig Dispense Refill  . ALPRAZolam (XANAX) 0.25 MG tablet Take 1 tablet (0.25 mg total) by mouth 2 (two) times daily as needed for sleep. 60 tablet 3  . aspirin 325 MG tablet Take 325 mg by mouth every morning.     . cloNIDine (CATAPRES) 0.1 MG tablet Take 1 tablet (0.1 mg total) by mouth 2 (two) times daily. 60 tablet 11  . famotidine (PEPCID) 20 MG tablet Take 1 tablet (20 mg total) by mouth at bedtime. 30 tablet 11  . FLUoxetine (PROZAC) 40 MG capsule Take 1 capsule (40 mg total) by mouth every morning. 30 capsule 11  . furosemide (LASIX) 40 MG tablet Take 40 mg by mouth 2 (two) times daily.    . insulin NPH-regular Human (NOVOLIN 70/30) (70-30) 100 UNIT/ML injection 7 units sq per day 10 mL 5  . nebivolol (BYSTOLIC) 10 MG tablet Take 1 tablet (10 mg total) by mouth daily. Lot # [id number] exp 10/16 30 tablet 11  . omeprazole (PRILOSEC) 20 MG capsule Take 1 capsule (20 mg total) by mouth  daily before breakfast. 30 capsule 11  . potassium chloride SA (K-DUR,KLOR-CON) 20 MEQ tablet Take 2 tablets (40 mEq total) by mouth daily. 60 tablet 11   No current facility-administered medications on file prior to visit.     Review of Systems  Constitutional: Negative for unusual diaphoresis or other sweats  HENT: Negative for ringing in ear Eyes: Negative for double vision or worsening visual disturbance.  Respiratory: Negative for choking and stridor.   Gastrointestinal: Negative for vomiting or other signifcant bowel change Genitourinary: Negative for hematuria or decreased urine volume.  Musculoskeletal: Negative for other MSK pain or swelling Skin: Negative for color change and  worsening wound.  Neurological: Negative for tremors and numbness other than noted  Psychiatric/Behavioral: Negative for decreased concentration or agitation other than above       Objective:   Physical Exam BP 98/64 mmHg  Pulse 81  Temp(Src) 97.4 F (36.3 C) (Oral)  Wt 212 lb 6 oz (96.333 kg)  SpO2 91% VS noted,  Constitutional: Pt appears well-developed, well-nourished.  HENT: Head: NCAT.  Right Ear: External ear normal.  Left Ear: External ear normal.  Eyes: . Pupils are equal, round, and reactive to light. Conjunctivae and EOM are normal Neck: Normal range of motion. Neck supple.  Cardiovascular: Normal rate and regular rhythm.   Pulmonary/Chest: Effort normal and breath sounds bibas crackles.  Abd:  Soft, NT, ND, + BS Neurological: Pt is alert. Not confused , motor grossly intact Skin: Skin is warm. No rash Psychiatric: Pt behavior is normal. No agitation.         Assessment & Plan:

## 2014-09-02 NOTE — Patient Instructions (Addendum)
For cough / congestion > mucinex dm 1200 mg every 12 hours   Prednisone 10 mg take  4 each am x 2 days,   2 each am x 2 days,  1 each am x 2 days and stop   Please see patient coordinator before you leave today  to schedule home delivery of your nebulizer med asap   Please remember to go to the  x-ray department downstairs for your tests - we will call you with the results when they are available.  Please schedule a follow up visit in 3 months but call sooner if needed  Late add > go to lab also

## 2014-09-02 NOTE — Assessment & Plan Note (Addendum)
-   Resting sats on 2lpm 07/09/2013 =  96% vs walking on 4lpm = desat x 100 ft walking at a near nl pace with resting sats 87% RA - HCO03 32 on 09/02/14   As of 09/02/2014   02 3lpm at rest and 4lpm with exertion should be adequate

## 2014-09-02 NOTE — Assessment & Plan Note (Signed)
stable overall by history and exam, recent data reviewed with pt, and pt to continue medical treatment as before,  to f/u any worsening symptoms or concerns Lab Results  Component Value Date   HGBA1C 4.0 12/15/2013

## 2014-09-02 NOTE — Patient Instructions (Signed)
Please take all new medication as prescribed - the wellbutrin for low mood  Please continue all other medications as before, and refills have been done if requested.  Please have the pharmacy call with any other refills you may need.  Please continue your efforts at being more active, low cholesterol diet, and weight control.  You are otherwise up to date with prevention measures today.  Please keep your appointments with your specialists as you may have planned  Please go to the LAB in the Basement (turn left off the elevator) for the tests to be done today  You will be contacted by phone if any changes need to be made immediately.  Otherwise, you will receive a letter about your results with an explanation, but please check with MyChart first.  Please remember to sign up for MyChart if you have not done so, as this will be important to you in the future with finding out test results, communicating by private email, and scheduling acute appointments online when needed.  Please return in 6 months, or sooner if needed

## 2014-09-03 ENCOUNTER — Telehealth: Payer: Self-pay

## 2014-09-03 ENCOUNTER — Telehealth: Payer: Self-pay | Admitting: Internal Medicine

## 2014-09-03 DIAGNOSIS — E119 Type 2 diabetes mellitus without complications: Secondary | ICD-10-CM

## 2014-09-03 LAB — HEMOGLOBIN A1C
HEMOGLOBIN A1C: 4.3 % (ref <5.7)
Mean Plasma Glucose: 77 mg/dL (ref <117)

## 2014-09-03 NOTE — Telephone Encounter (Signed)
Spoke with [facility] and advised that we can leave off the as needed so that Medicare will cover this.

## 2014-09-03 NOTE — Progress Notes (Signed)
Quick Note:  LMTCB ______ 

## 2014-09-03 NOTE — Progress Notes (Signed)
Quick Note:  Patients daughter notified. Nothing further needed. ______

## 2014-09-03 NOTE — Telephone Encounter (Signed)
Spoke with daughter.  Gave lab results.  Nothing further needed.

## 2014-09-03 NOTE — Telephone Encounter (Signed)
Lab entered

## 2014-09-05 ENCOUNTER — Encounter: Payer: Self-pay | Admitting: Internal Medicine

## 2014-09-05 NOTE — Assessment & Plan Note (Signed)
Multifactorial and unlikely to improve at this point.  Her daughter appears to be taking good care of her but there is a tendency to polypharmacy here and I attempted to work with them before using a universal med calendar format but they have not kept up with it so accurate and meaningful med reconciliation may be a major challenge going forward

## 2014-09-06 NOTE — Progress Notes (Signed)
Order(s) created erroneously. Erroneous order ID: [id number]  Order moved by: Zarya Lasseigne M  Order move date/time: 09/06/2014 3:33 PM  Source Patient: [id number]  Source Contact: 09/02/2014  Destination Patient: [id number]  Destination Contact: 12/30/2012 

## 2014-09-30 ENCOUNTER — Ambulatory Visit: Payer: Medicare Other | Admitting: Internal Medicine

## 2014-10-04 ENCOUNTER — Telehealth: Payer: Self-pay | Admitting: *Deleted

## 2014-10-04 NOTE — Telephone Encounter (Signed)
Left msg on triage stating pt was referred with our services. For new patients need to get pt last A1c for last 3 months, and alos wanting to see if we can have order for SW consult. The pt daughter is not dealing with the fact that mom is not able to get around anymore....Raechel Chute/lmb

## 2014-10-04 NOTE — Telephone Encounter (Signed)
[facility] - Client TELEPHONE ADVICE RECORD [name] Gender: Female DOB: [birth year] Age: [age] Y 1 M 24 D Return Phone Number: [phone] (Primary) Address: City/State/Zip:  [facility] Client Client Site [facility] - Night Physician Oliver BarreJohn, James Contact Type Call Call Type Page Only Caller Name Bjorn LoserRhonda Relationship To Patient Provider Is this call to report lab results? No Return Phone Number [phone] (Primary) Initial Comment Caller states [facility] , pt got out of hospital and BP is elevated, legs give out and appetite is poor, BS this am is 306 and is on steroids Nurse Assessment Guidelines Guideline Title Affirmed Question Affirmed Notes Nurse Date/Time (Eastern Time) Disp. Time Lamount Cohen(Eastern Time) Disposition Final User 10/02/2014 12:17:58 PM [facility] P[facility]ue [place], Cristie HemDesiree 10/02/2014 12:38:31 PM Called On-Call Provider Armando ReichertMorazan, Valesca 10/02/2014 12:38:55 PM Page Completed Yes Armando ReichertMorazan, Valesca After Care Instructions Given Call Event Type User Date / Time Description Paging DoctorName DoctorPhone DateTime Result/Outcome Notes Gershon CraneFry, Stephen [phone] 10/02/2014 12:38:31 PM Called On Call Provider - Reached Gershon CraneFry, Stephen 10/02/2014 12:38:48 PM Spoke with On Call - General

## 2014-10-04 NOTE — Telephone Encounter (Signed)
No need for a1c as has been completely normal for the past yr  OK for verbal for SW consult

## 2014-10-05 NOTE — Telephone Encounter (Signed)
Called Sarah Kirk no answer LMOM with md response...Sarah Kirk/lmb

## 2014-10-10 ENCOUNTER — Encounter (HOSPITAL_COMMUNITY): Payer: Self-pay

## 2014-10-10 ENCOUNTER — Inpatient Hospital Stay (HOSPITAL_COMMUNITY)
Admission: AD | Admit: 2014-10-10 | Discharge: 2014-10-23 | DRG: 871 | Disposition: A | Discharge status: Skilled Nursing Facility | Payer: Medicare Other | Source: Other Acute Inpatient Hospital | Attending: Internal Medicine | Admitting: Internal Medicine

## 2014-10-10 DIAGNOSIS — J189 Pneumonia, unspecified organism: Secondary | ICD-10-CM | POA: Diagnosis not present

## 2014-10-10 DIAGNOSIS — E119 Type 2 diabetes mellitus without complications: Secondary | ICD-10-CM | POA: Diagnosis not present

## 2014-10-10 DIAGNOSIS — I509 Heart failure, unspecified: Secondary | ICD-10-CM

## 2014-10-10 DIAGNOSIS — Z66 Do not resuscitate: Secondary | ICD-10-CM | POA: Diagnosis not present

## 2014-10-10 DIAGNOSIS — Z7952 Long term (current) use of systemic steroids: Secondary | ICD-10-CM | POA: Diagnosis not present

## 2014-10-10 DIAGNOSIS — F329 Major depressive disorder, single episode, unspecified: Secondary | ICD-10-CM | POA: Diagnosis not present

## 2014-10-10 DIAGNOSIS — Z515 Encounter for palliative care: Secondary | ICD-10-CM | POA: Diagnosis not present

## 2014-10-10 DIAGNOSIS — I10 Essential (primary) hypertension: Secondary | ICD-10-CM

## 2014-10-10 DIAGNOSIS — K219 Gastro-esophageal reflux disease without esophagitis: Secondary | ICD-10-CM | POA: Diagnosis present

## 2014-10-10 DIAGNOSIS — Z87440 Personal history of urinary (tract) infections: Secondary | ICD-10-CM | POA: Diagnosis not present

## 2014-10-10 DIAGNOSIS — J9601 Acute respiratory failure with hypoxia: Secondary | ICD-10-CM

## 2014-10-10 DIAGNOSIS — J9621 Acute and chronic respiratory failure with hypoxia: Secondary | ICD-10-CM | POA: Diagnosis present

## 2014-10-10 DIAGNOSIS — Y95 Nosocomial condition: Secondary | ICD-10-CM | POA: Diagnosis present

## 2014-10-10 DIAGNOSIS — Z79899 Other long term (current) drug therapy: Secondary | ICD-10-CM

## 2014-10-10 DIAGNOSIS — G9341 Metabolic encephalopathy: Secondary | ICD-10-CM | POA: Diagnosis not present

## 2014-10-10 DIAGNOSIS — G9340 Encephalopathy, unspecified: Secondary | ICD-10-CM

## 2014-10-10 DIAGNOSIS — J9690 Respiratory failure, unspecified, unspecified whether with hypoxia or hypercapnia: Secondary | ICD-10-CM

## 2014-10-10 DIAGNOSIS — Z888 Allergy status to other drugs, medicaments and biological substances status: Secondary | ICD-10-CM | POA: Diagnosis not present

## 2014-10-10 DIAGNOSIS — Z794 Long term (current) use of insulin: Secondary | ICD-10-CM | POA: Diagnosis not present

## 2014-10-10 DIAGNOSIS — A419 Sepsis, unspecified organism: Principal | ICD-10-CM | POA: Diagnosis present

## 2014-10-10 DIAGNOSIS — Z87891 Personal history of nicotine dependence: Secondary | ICD-10-CM

## 2014-10-10 DIAGNOSIS — N390 Urinary tract infection, site not specified: Secondary | ICD-10-CM

## 2014-10-10 DIAGNOSIS — J9612 Chronic respiratory failure with hypercapnia: Secondary | ICD-10-CM

## 2014-10-10 DIAGNOSIS — E876 Hypokalemia: Secondary | ICD-10-CM | POA: Diagnosis not present

## 2014-10-10 DIAGNOSIS — E785 Hyperlipidemia, unspecified: Secondary | ICD-10-CM

## 2014-10-10 DIAGNOSIS — Z6827 Body mass index (BMI) 27.0-27.9, adult: Secondary | ICD-10-CM | POA: Diagnosis not present

## 2014-10-10 DIAGNOSIS — I4891 Unspecified atrial fibrillation: Secondary | ICD-10-CM | POA: Diagnosis not present

## 2014-10-10 DIAGNOSIS — Z7409 Other reduced mobility: Secondary | ICD-10-CM

## 2014-10-10 DIAGNOSIS — Z7982 Long term (current) use of aspirin: Secondary | ICD-10-CM

## 2014-10-10 DIAGNOSIS — F32A Depression, unspecified: Secondary | ICD-10-CM

## 2014-10-10 DIAGNOSIS — R0600 Dyspnea, unspecified: Secondary | ICD-10-CM

## 2014-10-10 DIAGNOSIS — R1310 Dysphagia, unspecified: Secondary | ICD-10-CM | POA: Diagnosis present

## 2014-10-10 DIAGNOSIS — Z881 Allergy status to other antibiotic agents status: Secondary | ICD-10-CM

## 2014-10-10 DIAGNOSIS — I5033 Acute on chronic diastolic (congestive) heart failure: Secondary | ICD-10-CM | POA: Diagnosis not present

## 2014-10-10 DIAGNOSIS — J8410 Pulmonary fibrosis, unspecified: Secondary | ICD-10-CM

## 2014-10-10 DIAGNOSIS — J9600 Acute respiratory failure, unspecified whether with hypoxia or hypercapnia: Secondary | ICD-10-CM

## 2014-10-10 DIAGNOSIS — F411 Generalized anxiety disorder: Secondary | ICD-10-CM

## 2014-10-10 DIAGNOSIS — I5030 Unspecified diastolic (congestive) heart failure: Secondary | ICD-10-CM

## 2014-10-10 DIAGNOSIS — R0689 Other abnormalities of breathing: Secondary | ICD-10-CM

## 2014-10-10 LAB — GLUCOSE, CAPILLARY
GLUCOSE-CAPILLARY: 115 mg/dL — AB (ref 70–99)
Glucose-Capillary: 131 mg/dL — ABNORMAL HIGH (ref 70–99)
Glucose-Capillary: 164 mg/dL — ABNORMAL HIGH (ref 70–99)

## 2014-10-10 LAB — MRSA PCR SCREENING: MRSA BY PCR: NEGATIVE

## 2014-10-10 LAB — CREATININE, SERUM
CREATININE: 0.77 mg/dL (ref 0.50–1.10)
GFR calc Af Amer: >90 mL/min (ref >90)
GFR calc non Af Amer: 78 mL/min — ABNORMAL LOW (ref >90)

## 2014-10-10 LAB — URINALYSIS, ROUTINE W REFLEX MICROSCOPIC
Bilirubin Urine: NEGATIVE
GLUCOSE, UA: NEGATIVE mg/dL
Hgb urine dipstick: NEGATIVE
Ketones, ur: NEGATIVE mg/dL
Leukocytes, UA: NEGATIVE
NITRITE: NEGATIVE
PH: 5 (ref 5.0–8.0)
Protein, ur: NEGATIVE mg/dL
SPECIFIC GRAVITY, URINE: 1.009 (ref 1.005–1.030)
Urobilinogen, UA: 0.2 mg/dL (ref 0.0–1.0)

## 2014-10-10 MED ORDER — VANCOMYCIN HCL 10 G IV SOLR
1750.0000 mg | Freq: Once | INTRAVENOUS | Status: AC
  Administered 2014-10-10: 1750 mg via INTRAVENOUS
  Filled 2014-10-10: qty 1750

## 2014-10-10 MED ORDER — CHLORHEXIDINE GLUCONATE 0.12 % MT SOLN
15.0000 mL | Freq: Two times a day (BID) | OROMUCOSAL | Status: DC
  Administered 2014-10-10 – 2014-10-15 (×10): 15 mL via OROMUCOSAL
  Filled 2014-10-10 (×12): qty 15

## 2014-10-10 MED ORDER — IPRATROPIUM BROMIDE 0.02 % IN SOLN
0.5000 mg | Freq: Four times a day (QID) | RESPIRATORY_TRACT | Status: DC
  Administered 2014-10-10 – 2014-10-18 (×32): 0.5 mg via RESPIRATORY_TRACT
  Filled 2014-10-10 (×31): qty 2.5

## 2014-10-10 MED ORDER — ENOXAPARIN SODIUM 40 MG/0.4ML ~~LOC~~ SOLN
40.0000 mg | SUBCUTANEOUS | Status: DC
  Administered 2014-10-10 – 2014-10-23 (×14): 40 mg via SUBCUTANEOUS
  Filled 2014-10-10 (×14): qty 0.4

## 2014-10-10 MED ORDER — FUROSEMIDE 10 MG/ML IJ SOLN
40.0000 mg | Freq: Once | INTRAMUSCULAR | Status: AC
  Administered 2014-10-10: 40 mg via INTRAVENOUS
  Filled 2014-10-10: qty 4

## 2014-10-10 MED ORDER — MORPHINE SULFATE 2 MG/ML IJ SOLN
1.0000 mg | INTRAMUSCULAR | Status: DC | PRN
  Administered 2014-10-10 – 2014-10-12 (×5): 1 mg via INTRAVENOUS
  Filled 2014-10-10 (×5): qty 1

## 2014-10-10 MED ORDER — LEVALBUTEROL HCL 0.63 MG/3ML IN NEBU
0.6300 mg | INHALATION_SOLUTION | Freq: Four times a day (QID) | RESPIRATORY_TRACT | Status: DC
  Administered 2014-10-10 – 2014-10-18 (×32): 0.63 mg via RESPIRATORY_TRACT
  Filled 2014-10-10 (×60): qty 3

## 2014-10-10 MED ORDER — SODIUM CHLORIDE 0.9 % IV SOLN
INTRAVENOUS | Status: DC
  Administered 2014-10-10: 16:00:00 via INTRAVENOUS

## 2014-10-10 MED ORDER — DEXTROSE 5 % IV SOLN
2.0000 g | Freq: Two times a day (BID) | INTRAVENOUS | Status: AC
  Administered 2014-10-10 – 2014-10-23 (×28): 2 g via INTRAVENOUS
  Filled 2014-10-10 (×30): qty 2

## 2014-10-10 MED ORDER — DILTIAZEM HCL 100 MG IV SOLR
5.0000 mg/h | INTRAVENOUS | Status: DC
  Administered 2014-10-10: 15 mg/h via INTRAVENOUS
  Administered 2014-10-10: 5 mg/h via INTRAVENOUS
  Filled 2014-10-10: qty 100

## 2014-10-10 MED ORDER — DILTIAZEM LOAD VIA INFUSION
10.0000 mg | Freq: Once | INTRAVENOUS | Status: AC
Start: 2014-10-10 — End: 2014-10-10
  Administered 2014-10-10: 10 mg via INTRAVENOUS
  Filled 2014-10-10: qty 10

## 2014-10-10 MED ORDER — METOPROLOL TARTRATE 1 MG/ML IV SOLN
2.5000 mg | Freq: Four times a day (QID) | INTRAVENOUS | Status: DC
  Administered 2014-10-10 – 2014-10-11 (×4): 2.5 mg via INTRAVENOUS
  Filled 2014-10-10 (×8): qty 5

## 2014-10-10 MED ORDER — ONDANSETRON HCL 4 MG/2ML IJ SOLN
4.0000 mg | Freq: Four times a day (QID) | INTRAMUSCULAR | Status: DC | PRN
  Administered 2014-10-19: 4 mg via INTRAVENOUS
  Filled 2014-10-10: qty 2

## 2014-10-10 MED ORDER — METHYLPREDNISOLONE SODIUM SUCC 40 MG IJ SOLR
40.0000 mg | Freq: Four times a day (QID) | INTRAMUSCULAR | Status: DC
  Administered 2014-10-10 – 2014-10-11 (×4): 40 mg via INTRAVENOUS
  Filled 2014-10-10 (×8): qty 1

## 2014-10-10 MED ORDER — VANCOMYCIN HCL IN DEXTROSE 750-5 MG/150ML-% IV SOLN
750.0000 mg | Freq: Two times a day (BID) | INTRAVENOUS | Status: DC
  Administered 2014-10-11 – 2014-10-12 (×3): 750 mg via INTRAVENOUS
  Filled 2014-10-10 (×4): qty 150

## 2014-10-10 MED ORDER — CETYLPYRIDINIUM CHLORIDE 0.05 % MT LIQD
7.0000 mL | Freq: Two times a day (BID) | OROMUCOSAL | Status: DC
  Administered 2014-10-11 – 2014-10-15 (×9): 7 mL via OROMUCOSAL

## 2014-10-10 MED ORDER — INSULIN ASPART 100 UNIT/ML ~~LOC~~ SOLN
0.0000 [IU] | SUBCUTANEOUS | Status: DC
  Administered 2014-10-10: 3 [IU] via SUBCUTANEOUS
  Administered 2014-10-10: 4 [IU] via SUBCUTANEOUS
  Administered 2014-10-11 (×2): 3 [IU] via SUBCUTANEOUS
  Administered 2014-10-11: 4 [IU] via SUBCUTANEOUS
  Administered 2014-10-11: 3 [IU] via SUBCUTANEOUS
  Administered 2014-10-12: 4 [IU] via SUBCUTANEOUS
  Administered 2014-10-12 (×2): 3 [IU] via SUBCUTANEOUS
  Administered 2014-10-12: 4 [IU] via SUBCUTANEOUS
  Administered 2014-10-12: 3 [IU] via SUBCUTANEOUS
  Administered 2014-10-13: 11 [IU] via SUBCUTANEOUS
  Administered 2014-10-13: 7 [IU] via SUBCUTANEOUS
  Administered 2014-10-14: 4 [IU] via SUBCUTANEOUS
  Administered 2014-10-14: 7 [IU] via SUBCUTANEOUS
  Administered 2014-10-14: 3 [IU] via SUBCUTANEOUS
  Administered 2014-10-14: 7 [IU] via SUBCUTANEOUS
  Administered 2014-10-15 (×2): 3 [IU] via SUBCUTANEOUS
  Administered 2014-10-15: 7 [IU] via SUBCUTANEOUS
  Administered 2014-10-15: 4 [IU] via SUBCUTANEOUS
  Administered 2014-10-15: 7 [IU] via SUBCUTANEOUS
  Administered 2014-10-15: 4 [IU] via SUBCUTANEOUS
  Administered 2014-10-16: 3 [IU] via SUBCUTANEOUS
  Administered 2014-10-16: 4 [IU] via SUBCUTANEOUS
  Administered 2014-10-16: 3 [IU] via SUBCUTANEOUS
  Administered 2014-10-16 – 2014-10-17 (×3): 4 [IU] via SUBCUTANEOUS
  Administered 2014-10-17: 3 [IU] via SUBCUTANEOUS
  Administered 2014-10-18: 4 [IU] via SUBCUTANEOUS
  Administered 2014-10-18: 3 [IU] via SUBCUTANEOUS

## 2014-10-10 MED ORDER — IPRATROPIUM BROMIDE 0.02 % IN SOLN
RESPIRATORY_TRACT | Status: AC
  Administered 2014-10-10: 0.5 mg via RESPIRATORY_TRACT
  Filled 2014-10-10: qty 2.5

## 2014-10-10 MED ORDER — PANTOPRAZOLE SODIUM 40 MG IV SOLR
40.0000 mg | INTRAVENOUS | Status: DC
  Administered 2014-10-10 – 2014-10-18 (×9): 40 mg via INTRAVENOUS
  Filled 2014-10-10 (×11): qty 40

## 2014-10-10 NOTE — Progress Notes (Signed)
ANTIBIOTIC/ANTICOAGULATION CONSULT NOTE - INITIAL  Pharmacy Consult for cefepime + vancomycin & lovenox Indication: pneumonia and VTE prophylaxis  Allergies  Allergen Reactions  . Tetracyclines & Related Other (See Comments)    Breaks her mouth out  . Macrobid [Nitrofurantoin]     Worsening lung fx  . Metformin     Doctor doesn't want pt to take because it will scar her lungs    Patient Measurements: Height: 5\' 3"  (160 cm) Weight: 188 lb (85.276 kg) IBW/kg (Calculated) : 52.4  Vital Signs: BP: 116/98 mmHg (12/27 1340) Pulse Rate: 131 (12/27 1340) Intake/Output from previous day:   Intake/Output from this shift:    Labs: No results for input(s): WBC, HGB, PLT, LABCREA, CREATININE in the last 72 hours. Estimated Creatinine Clearance: 47.2 mL/min (by C-G formula based on Cr of 1). No results for input(s): VANCOTROUGH, VANCOPEAK, VANCORANDOM, GENTTROUGH, GENTPEAK, GENTRANDOM, TOBRATROUGH, TOBRAPEAK, TOBRARND, AMIKACINPEAK, AMIKACINTROU, AMIKACIN in the last 72 hours.   Normalized CrCl~7451ml/min  Microbiology: No results found for this or any previous visit (from the past 720 hour(s)).  Medical History: Past Medical History  Diagnosis Date  . HBP (high blood pressure)   . CHF (congestive heart failure)   . HLD (hyperlipidemia)   . Morbid obesity     PFTs 07/14/08 moderate restriction vital capacity 58% with ERV 32% DLCO 38, corrects to 108  . Right heart enlargement     CT 07/16/08. nocturnal desat x 25 min 07/29/08  . Atrial fibrillation   . Diabetes mellitus without complication   . PONV (postoperative nausea and vomiting)   . Depression     Medications:  Scheduled:  . furosemide  40 mg Intravenous Once  . insulin aspart  0-20 Units Subcutaneous 6 times per day  . ipratropium  0.5 mg Nebulization Q6H  . levalbuterol  0.63 mg Nebulization Q6H  . methylPREDNISolone (SOLU-MEDROL) injection  40 mg Intravenous Q6H  . metoprolol  2.5 mg Intravenous 4 times per day  .  pantoprazole (PROTONIX) IV  40 mg Intravenous Q24H   Assessment: 80 yo F admitted from [facility] 12/27 with dyspnea, confusion, and tachycardia. Pharmacy consulted to dose vancomycin and cefepime for HCAP. No labs. SCr from 09/02/2014 1.0, used to calculate normalized CrCl ~ 51 ml/min.   Pharmacy also consulted to dose lovenox for VTE prophylaxis. Labs pending. Patient BMI = 33.  Goal of Therapy:  Vancomycin trough level 15-20 mcg/ml  Eradication of infection Prevention of DVT  Plan:  Vancomycin 1750mg  IV x 1, then Vanc 750mg  q12h Cefepime 2g IV q12h Lovenox 40mg  SQ q24h F/u labs for antibiotic dose adjustment Monitor renal function, clinical progress  Thank you for allowing pharmacy to be part of this patient's care team  Jabir Dahlem M. Sallie Maker, Pharm.D Clinical Pharmacy Resident Pager: [phone] 10/10/2014 .2:36 PM

## 2014-10-10 NOTE — Progress Notes (Signed)
eLink Physician-Brief Progress Note Patient Name: Sarah BirchwoodHelen Kirk DOB: 04-Dec-1932 MRN: [id number]   Date of Service  10/10/2014  HPI/Events of Note  incont of urine on lasix  eICU Interventions  Place foley for now     Intervention Category Minor Interventions: Routine modifications to care plan (e.g. PRN medications for pain, fever)  Sarah HughsMichael Sarah Kirk 10/10/2014, 3:59 PM

## 2014-10-10 NOTE — Progress Notes (Signed)
Pts HR remains in 130's-140's after two doses of metoprolol IV 2.5 mg. Notified Dr. Sherene SiresWert and received new orders to start cardizem drip. Will continue to monitor pt.

## 2014-10-10 NOTE — Progress Notes (Signed)
eLink Physician-Brief Progress Note Patient Name: Sarah BirchwoodHelen Kirk DOB: 07-12-1933 MRN: [id number]   Date of Service  10/10/2014  HPI/Events of Note  Rapid afib not responding to lopressor bolus, had been on cardizem drip earlier today  Per nursing   eICU Interventions  Restart cardizem      Intervention Category Major Interventions: Arrhythmia - evaluation and management  Sandrea HughsMichael Saoirse Legere 10/10/2014, 6:16 PM

## 2014-10-10 NOTE — Progress Notes (Signed)
Pt taken off bipap by RN per MD..Pt is currently on venti mask

## 2014-10-10 NOTE — H&P (Signed)
PULMONARY / CRITICAL CARE MEDICINE   Name: Sarah Kirk MRN: [id number] DOB: [birth year]    ADMISSION DATE:  10/10/2014  REF[facility] MD :  [facility]  CHIEF COMPLAINT:  Short of breath  INITIAL PRESENTATION:  80 yo female from [facility] with dyspnea, confusion, tachycardia from PNA, CHF, A fib with RVR.  She is followed by Dr. Sherene SiresWert [facility]  She has hx of recurrent UTI's and had recent admission for Pseudomonal UTI.  STUDIES:    SIGNIFICANT EVENTS: 12/27 [facility]   HISTORY OF PRESENT ILLNESS:   Pt unable to provide hx.  Hx from medical records and discussion with her daughter.  She has chronic dyspnea and is on 3 liters oxygen.  She is frequently on prednisone for her breathing and antibiotics for UTI's.  Her daughter says that she was in hospital recently for Pseudomonal UTI with sepsis, and sent to rehab.  She was noted to have decreased appetite and confusion at rehab.  Her oxygen level was in the 70's.  [facility].  Her chest xray there showed perihilar infiltrates.  She was more confused, short of breath, and hypoxic.  She also had a fib with RVR.  She was started on oxygen and Abx.  She required BiPAP [facility].  Her daughter is next of kin, and pt has one sister.  They were in the process of making a living will.  Patient has told them before that she would never want to be on life support for any reason.  PAST MEDICAL HISTORY :   has a past medical history of HBP (high blood pressure); CHF (congestive heart failure); HLD (hyperlipidemia); Morbid obesity; Right heart enlargement; Atrial fibrillation; Diabetes mellitus without complication; PONV (postoperative nausea and vomiting); and Depression.  has past surgical history that includes broken wrist; Cataract extraction (Bilateral, 2006); Abdominal hysterectomy (1969); and Breast surgery (1964). Prior to Admission medications   Medication  Sig Start Date End Date Taking? Authorizing Provider  albuterol (PROVENTIL) (2.5 MG/3ML) 0.083% nebulizer solution Take 3 mLs (2.5 mg total) by nebulization every 4 (four) hours as needed for wheezing or shortness of breath. 09/02/14   Nyoka CowdenMichael B Wert, MD  ALPRAZolam Prudy Feeler(XANAX) 0.25 MG tablet Take 1 tablet (0.25 mg total) by mouth 2 (two) times daily as needed for sleep. 05/11/14   Corwin LevinsJames W John, MD  aspirin 325 MG tablet Take 325 mg by mouth every morning.     Historical Provider, MD  buPROPion (WELLBUTRIN SR) 100 MG 12 hr tablet Take 1 tablet (100 mg total) by mouth 2 (two) times daily. 09/02/14   Corwin LevinsJames W John, MD  cloNIDine (CATAPRES) 0.1 MG tablet Take 1 tablet (0.1 mg total) by mouth 2 (two) times daily. 01/26/14   Corwin LevinsJames W John, MD  famotidine (PEPCID) 20 MG tablet Take 1 tablet (20 mg total) by mouth at bedtime. 01/26/14   Corwin LevinsJames W John, MD  FLUoxetine (PROZAC) 40 MG capsule Take 1 capsule (40 mg total) by mouth every morning. 01/26/14   Corwin LevinsJames W John, MD  furosemide (LASIX) 40 MG tablet Take 40 mg by mouth 2 (two) times daily. 12/15/13   Baltazar ApoNancy Hartman, PA-C  insulin NPH-regular Human (NOVOLIN 70/30) (70-30) 100 UNIT/ML injection 7 units sq per day 01/26/14   Corwin LevinsJames W John, MD  nebivolol (BYSTOLIC) 10 MG tablet Take 1 tablet (10 mg total) by mouth daily. Lot # [id number] exp 10/16 01/26/14   Corwin LevinsJames W John, MD  omeprazole (PRILOSEC) 20 MG capsule Take 1 capsule (20 mg total) by mouth daily before breakfast. 01/26/14   Corwin Levins, MD  OXYBUTYNIN CHLORIDE PO Take 1 tablet by mouth daily.    Historical Provider, MD  potassium chloride SA (K-DUR,KLOR-CON) 20 MEQ tablet Take 2 tablets (40 mEq total) by mouth daily. 01/26/14   Corwin Levins, MD  predniSONE (DELTASONE) 10 MG tablet Take  4 each am x 2 days,   2 each am x 2 days,  1 each am x 2 days and stop 09/02/14   Nyoka Cowden, MD  Sulfamethoxazole-Trimethoprim (BACTRIM PO) Take 1 tablet by mouth at bedtime.    Historical Provider, MD   Allergies  Allergen  Reactions  . Tetracyclines & Related Other (See Comments)    Breaks her mouth out  . Macrobid [Nitrofurantoin]     Worsening lung fx  . Metformin     Doctor doesn't want pt to take because it will scar her lungs    FAMILY HISTORY:  has no family status information on file.  SOCIAL HISTORY:  reports that she quit smoking about 35 years ago. Her smoking use included Cigarettes. She has a 20 pack-year smoking history. She has never used smokeless tobacco. She reports that she does not drink alcohol or use illicit drugs.  REVIEW OF SYSTEMS:   Unable to obtain  SUBJECTIVE:   VITAL SIGNS: Pulse Rate:  [131] 131 (12/27 1340) Resp:  [27] 27 (12/27 1340) BP: (116)/(98) 116/98 mmHg (12/27 1340) SpO2:  [100 %] 100 % (12/27 1340) Weight:  [188 lb (85.276 kg)] 188 lb (85.276 kg) (12/27 1300) INTAKE / OUTPUT: No intake or output data in the 24 hours ending 10/10/14 1429  PHYSICAL EXAMINATION: General:  Ill appearing, moaning, head slumped over Neuro:  Somnolent, wakes up with stimulation, follows simple commands, mumbles, moves extremities HEENT:  BiPAP mask on Cardiovascular:  Irregular, tachycardic Lungs:  B/l rales Abdomen:  Obese, soft, non tender Musculoskeletal:  2+ edema Skin:  Multiple areas of ecchymoses  LABS:  CBC No results for input(s): WBC, HGB, HCT, PLT in the last 168 hours. Coag's No results for input(s): APTT, INR in the last 168 hours. BMET No results for input(s): NA, K, CL, CO2, BUN, CREATININE, GLUCOSE in the last 168 hours. Electrolytes No results for input(s): CALCIUM, MG, PHOS in the last 168 hours. Sepsis Markers No results for input(s): LATICACIDVEN, PROCALCITON, O2SATVEN in the last 168 hours. ABG No results for input(s): PHART, PCO2ART, PO2ART in the last 168 hours. Liver Enzymes No results for input(s): AST, ALT, ALKPHOS, BILITOT, ALBUMIN in the last 168 hours. Cardiac Enzymes No results for input(s): TROPONINI, PROBNP in the last 168  hours. Glucose No results for input(s): GLUCAP in the last 168 hours.  Imaging No results found.  Labs, CXR from [place] reviewed  ASSESSMENT / PLAN:  PULMONARY A: Acute on chronic respiratory failure from HCAP, CHF with hx of pulmonary fibrosis. DNR/DNI. P:   Oxygen to keep SpO2 88 to 94% BiPAP as needed F/u CXR Scheduled BDs Solumedrol Morphine prn for dyspnea  CARDIOVASCULAR Rt IJ CVL 12/26 [facility]) >> A:  A fib with RVR. Acute on chronic diastolic dysfx. P:  Lasix 40 mg IV x one Lopressor 2.5 mg IV q6h Monitor hemodynamics  RENAL A:  No acute issues. P:   Monitor renal fx, urine outpt, electrolytes  GASTROINTESTINAL A:   Nutrition. Hx of GERD. P:   NPO until respiratory status better Protonix for SUP  HEMATOLOGIC A:   Leukocytosis. P:  F/u CBC Lovenox for DVT prevention  INFECTIOUS A:   Sepsis. HCAP. Recurrent UTIs. P:   Day 1 vancomycin, cefepime  Blood 12/27 >> Urine 12/27 >>  ENDOCRINE A:   DM type II.   P:   SSI  NEUROLOGIC A:   Acute encephalopathy 2nd to sepsis, respiratory failure. P:   Monitor mental status   TODAY'S SUMMARY:  Updated pt's daughter at bedside.  Explained that Ms. Lattimore has very little reserve.  Will continue current tx.  Daughter states that Ms. Lattimore would not want intubation under any condition.  As such she also agreed to forego cardiac resuscitation in the event of cardiac arrest.  Will continue current therapies.  However, if her medical status continues to get worse, will then likely transition to comfort measures.  Coralyn HellingVineet Aviona Martenson, MD [facility] Pulmonary/Critical Care 10/10/2014, 2:43 PM Pager:  [phone] After 3pm call: [phone]

## 2014-10-11 ENCOUNTER — Inpatient Hospital Stay (HOSPITAL_COMMUNITY): Payer: Medicare Other

## 2014-10-11 DIAGNOSIS — Z515 Encounter for palliative care: Secondary | ICD-10-CM | POA: Insufficient documentation

## 2014-10-11 DIAGNOSIS — J9601 Acute respiratory failure with hypoxia: Secondary | ICD-10-CM

## 2014-10-11 DIAGNOSIS — Z66 Do not resuscitate: Secondary | ICD-10-CM

## 2014-10-11 DIAGNOSIS — R0600 Dyspnea, unspecified: Secondary | ICD-10-CM

## 2014-10-11 DIAGNOSIS — R0689 Other abnormalities of breathing: Secondary | ICD-10-CM

## 2014-10-11 LAB — GLUCOSE, CAPILLARY
GLUCOSE-CAPILLARY: 120 mg/dL — AB (ref 70–99)
Glucose-Capillary: 131 mg/dL — ABNORMAL HIGH (ref 70–99)
Glucose-Capillary: 161 mg/dL — ABNORMAL HIGH (ref 70–99)
Glucose-Capillary: 131 mg/dL — ABNORMAL HIGH (ref 70–99)
Glucose-Capillary: 149 mg/dL — ABNORMAL HIGH (ref 70–99)

## 2014-10-11 LAB — BASIC METABOLIC PANEL
ANION GAP: 8 (ref 5–15)
BUN: 11 mg/dL (ref 6–23)
CHLORIDE: 97 meq/L (ref 96–112)
CO2: 30 mmol/L (ref 19–32)
CREATININE: 0.66 mg/dL (ref 0.50–1.10)
Calcium: 8.5 mg/dL (ref 8.4–10.5)
GFR calc Af Amer: >90 mL/min (ref >90)
GFR, EST NON AFRICAN AMERICAN: 82 mL/min — AB (ref >90)
Glucose, Bld: 141 mg/dL — ABNORMAL HIGH (ref 70–99)
POTASSIUM: 3.6 mmol/L (ref 3.5–5.1)
Sodium: 135 mmol/L (ref 135–145)

## 2014-10-11 LAB — URINE CULTURE
Colony Count: NO GROWTH
Culture: NO GROWTH

## 2014-10-11 LAB — CBC
HCT: 33.1 % — ABNORMAL LOW (ref 36.0–46.0)
HEMOGLOBIN: 11.2 g/dL — AB (ref 12.0–15.0)
MCH: 29.3 pg (ref 26.0–34.0)
MCHC: 33.8 g/dL (ref 30.0–36.0)
MCV: 86.6 fL (ref 78.0–100.0)
PLATELETS: 119 10*3/uL — AB (ref 150–400)
RBC: 3.82 MIL/uL — AB (ref 3.87–5.11)
RDW: 15.8 % — ABNORMAL HIGH (ref 11.5–15.5)
WBC: 8.4 10*3/uL (ref 4.0–10.5)

## 2014-10-11 MED ORDER — METOPROLOL TARTRATE 1 MG/ML IV SOLN
5.0000 mg | Freq: Four times a day (QID) | INTRAVENOUS | Status: DC
  Administered 2014-10-11 – 2014-10-19 (×32): 5 mg via INTRAVENOUS
  Filled 2014-10-11 (×35): qty 5

## 2014-10-11 MED ORDER — FUROSEMIDE 10 MG/ML IJ SOLN
40.0000 mg | Freq: Two times a day (BID) | INTRAMUSCULAR | Status: DC
  Administered 2014-10-11 – 2014-10-12 (×4): 40 mg via INTRAVENOUS
  Filled 2014-10-11 (×6): qty 4

## 2014-10-11 MED ORDER — METHYLPREDNISOLONE SODIUM SUCC 40 MG IJ SOLR
40.0000 mg | Freq: Two times a day (BID) | INTRAMUSCULAR | Status: DC
  Administered 2014-10-11 – 2014-10-12 (×2): 40 mg via INTRAVENOUS
  Filled 2014-10-11 (×4): qty 1

## 2014-10-11 NOTE — Progress Notes (Signed)
INITIAL NUTRITION ASSESSMENT  DOCUMENTATION CODES Per approved criteria  -Not Applicable   INTERVENTION:  Diet advancement as able per MD pending goals of care. If diet advanced, consider adding Ensure Complete PO BID.  NUTRITION DIAGNOSIS: Inadequate oral intake related to decreased mental status as evidenced by NPO status.   Goal: Intake to meet >90% of estimated nutrition needs.  Monitor:  PO intake, labs, weight trend.  Reason for Assessment: Malnutrition Screening Tool  80 y.o. female  Admitting Dx: SOB  ASSESSMENT: 80 yo female [facility] with dyspnea, confusion, tachycardia from PNA, CHF, A fib with RVR.   Patient getting bath during RD visit, unable to complete full nutrition focused physical exam. She is much less responsive today than yesterday per RN and the team is considering palliative care. Suspect intake has been very poor over the past few months, given significant 11% weight loss within the past 1.5 months. Patient remains NPO at this time.  Height: Ht Readings from Last 1 Encounters:  10/10/14 5\' 9"  (1.753 m)    Weight: Wt Readings from Last 1 Encounters:  10/10/14 188 lb (85.276 kg)    Ideal Body Weight: 65.9 kg  % Ideal Body Weight: 129%  Wt Readings from Last 10 Encounters:  10/10/14 188 lb (85.276 kg)  09/02/14 212 lb 6 oz (96.333 kg)  09/02/14 212 lb (96.163 kg)  05/11/14 220 lb (99.791 kg)  02/10/14 227 lb (102.967 kg)  01/26/14 235 lb 8 oz (106.822 kg)  12/15/13 222 lb 6.4 oz (100.88 kg)  08/20/13 244 lb (110.678 kg)  07/09/13 239 lb (108.41 kg)  07/09/13 239 lb (108.41 kg)    Usual Body Weight: 212 lbs  % Usual Body Weight: 89%  BMI:  Body mass index is 27.75 kg/(m^2).  Estimated Nutritional Needs: Kcal: 1600-1800 Protein: 100-115 gm Fluid: 1.8 L  Skin: no issues  Diet Order: Diet NPO time specified  EDUCATION NEEDS: -Education not appropriate at this time   Intake/Output Summary (Last 24 hours) at  10/11/14 1115 Last data filed at 10/11/14 0900  Gross per 24 hour  Intake 619.91 ml  Output   1750 ml  Net -1130.09 ml    Last BM: PTA   Labs:   Recent Labs Lab 10/10/14 2145 10/11/14 0600  NA  --  135  K  --  3.6  CL  --  97  CO2  --  30  BUN  --  11  CREATININE 0.77 0.66  CALCIUM  --  8.5  GLUCOSE  --  141*    CBG (last 3)   Recent Labs  10/10/14 2346 10/11/14 0330 10/11/14 0829  GLUCAP 164* 131* 161*    Scheduled Meds: . antiseptic oral rinse  7 mL Mouth Rinse q12n4p  . ceFEPime (MAXIPIME) IV  2 g Intravenous Q12H  . chlorhexidine  15 mL Mouth Rinse BID  . enoxaparin (LOVENOX) injection  40 mg Subcutaneous Q24H  . furosemide  40 mg Intravenous Q12H  . insulin aspart  0-20 Units Subcutaneous 6 times per day  . ipratropium  0.5 mg Nebulization Q6H  . levalbuterol  0.63 mg Nebulization Q6H  . methylPREDNISolone (SOLU-MEDROL) injection  40 mg Intravenous Q12H  . metoprolol  5 mg Intravenous 4 times per day  . pantoprazole (PROTONIX) IV  40 mg Intravenous Q24H  . vancomycin  750 mg Intravenous Q12H    Continuous Infusions: . sodium chloride 10 mL/hr at 10/10/14 1900  . diltiazem (CARDIZEM) infusion 5 mg/hr (10/11/14 0900)  Past Medical History  Diagnosis Date  . HBP (high blood pressure)   . CHF (congestive heart failure)   . HLD (hyperlipidemia)   . Morbid obesity     PFTs 07/14/08 moderate restriction vital capacity 58% with ERV 32% DLCO 38, corrects to 108  . Right heart enlargement     CT 07/16/08. nocturnal desat x 25 min 07/29/08  . Atrial fibrillation   . Diabetes mellitus without complication   . PONV (postoperative nausea and vomiting)   . Depression     Past Surgical History  Procedure Laterality Date  . Broken wrist      x2  . Cataract extraction Bilateral 2006  . Abdominal hysterectomy  1969    abd?  . Breast surgery  1964    cyst-non cancerous    Joaquin CourtsKimberly Harris, RD, LDN, CNSC Pager [phone] After Hours Pager  [phone]

## 2014-10-11 NOTE — Consult Note (Signed)
Patient ZO:XWRUE:Sarah Kirk      DOB: 05/27/1933      AVW:[id number]     Consult Note from the Palliative Medicine Team at [facility]    Consult Requested by: Dr Vassie LollAlva     PCP: [name], MD Reason for Consultation:Goals of Care     Phone Number:[phone]  Assessment/Recommendations: 80 yo female with PMHx of pulm fibrosis, afib, diastolic HF who [facility] with confusion, dyspnea, tachycardia  1.  Code Status: DNR  2. Goals of Care: Did not get to explore much today with daughter. Much of our conversation was her expression of anger/frustration revolving around care at [facility].  This spans over multiple hospitalizations. She feels they did not communicate well with her and also did not look to records from [facility].  Felt multiple medicines changed that shouldn't have been, etc.  Other detailed portion of my conversation today revolved around understanding the various medical issues here including resp failure, PNA, encephalopathy, etc.  Daughter did speak with ICU team and her expression of hope for a miracle I think reflects her understanding of how seriously ill Sarah Kirk is.  I will plan on touching base with her tomorrow as well to let her know of what changes we see.  Dghtr, lives in [place], [place] and transportation is issue, so she may not be able to make it to hospital.   3. Symptom Management:   1. Dyspnea: Agree with low dose morphine PRN.  Daughter states she is very sensitive to pain medications and even a half dose of hydrocodone makes her sleepy at home.  Current morphine dose is similar to 1/2 hydrocodone tab.   2. Acute encephalopathy- multifactorial with sepsis, resp failure.  Would avoid ativan if agitation becomes issue (dghtr frustrated she was given ativan at nursing home) Could use low dose haldol PRN if needed.   4. Psychosocial/Spiritual:  Sarah Kirk has 1 daughter named Sarah DavenportSandra.  Had a son that passed away 3 years ago and husband  deceased >20 years ago. Sarah DavenportSandra has her own health issues as well.  They are a spiritual family and Sarah DavenportSandra requests Chaplain support/prayer for her mother.  I think Sarah DavenportSandra would benefit from chaplain counseling as well.    Brief HPI: Patient unable to provide any history.  Records reviewed in chart and further history from daughter.    Patient is a 80 yo female with PMHx of Pulm Fibrosis, CHF, Afib w/rvr, recurrent UTI.  [facility] with acute resp failure, confusion, dyspnea.  She has had multiple recent hospitalizations [facility] including on 12/16 where she was presumably treated for pseudomonas UTI.  She was discharged from there to a rehab facility and daughter had her transferred to hospital after noted to have confusion and dyspnea at nursing facility.  On transfer here, she has had continued hypoxic resp failure and encephalopathy.  She is being treated for presumed HCAP with broad spectrum abx, IV lasix for volume overload, cardizem for afib w/RVR, steroids for her resp failure and h/o pulm fibrosis.  She has remained lethargic despite ICU interventions. Her code status is DNR.     PMH:  Past Medical History  Diagnosis Date  . HBP (high blood pressure)   . CHF (congestive heart failure)   . HLD (hyperlipidemia)   . Morbid obesity     PFTs 07/14/08 moderate restriction vital capacity 58% with ERV 32% DLCO 38, corrects to 108  . Right heart  enlargement     CT 07/16/08. nocturnal desat x 25 min 07/29/08  . Atrial fibrillation   . Diabetes mellitus without complication   . PONV (postoperative nausea and vomiting)   . Depression      PSH: Past Surgical History  Procedure Laterality Date  . Broken wrist      x2  . Cataract extraction Bilateral 2006  . Abdominal hysterectomy  1969    abd?  . Breast surgery  1964    cyst-non cancerous   I have reviewed the FH and SH and  If appropriate update it with new information. Allergies  Allergen  Reactions  . Tetracyclines & Related Other (See Comments)    Breaks her mouth out  . Atenolol     Worsening lung function  . Macrobid [Nitrofurantoin]     Worsening lung fx  . Metformin     Doctor doesn't want pt to take because it will scar her lungs   Scheduled Meds: . antiseptic oral rinse  7 mL Mouth Rinse q12n4p  . ceFEPime (MAXIPIME) IV  2 g Intravenous Q12H  . chlorhexidine  15 mL Mouth Rinse BID  . enoxaparin (LOVENOX) injection  40 mg Subcutaneous Q24H  . furosemide  40 mg Intravenous Q12H  . insulin aspart  0-20 Units Subcutaneous 6 times per day  . ipratropium  0.5 mg Nebulization Q6H  . levalbuterol  0.63 mg Nebulization Q6H  . methylPREDNISolone (SOLU-MEDROL) injection  40 mg Intravenous Q12H  . metoprolol  5 mg Intravenous 4 times per day  . pantoprazole (PROTONIX) IV  40 mg Intravenous Q24H  . vancomycin  750 mg Intravenous Q12H   Continuous Infusions: . sodium chloride 10 mL/hr at 10/10/14 1900  . diltiazem (CARDIZEM) infusion Stopped (10/11/14 1535)   PRN Meds:.morphine injection, ondansetron    BP 134/74 mmHg  Pulse 61  Temp(Src) 97.9 F (36.6 C) (Axillary)  Resp 16  Ht 5\' 9"  (1.753 m)  Wt 85.276 kg (188 lb)  BMI 27.75 kg/m2  SpO2 95%   PPS: 20   Intake/Output Summary (Last 24 hours) at 10/11/14 1629 Last data filed at 10/11/14 1300  Gross per 24 hour  Intake 564.58 ml  Output   1900 ml  Net -1335.42 ml    Physical Exam:  General: Lethargic, difficult to arouse HEENT: [place], sclera anciteric, venti mask Neck: supple Chest:   Decreased breath sounds and rhonchi bilaterally CVS: irregular Abdomen: soft, ND Ext: edematous Neuro: not following commands  Labs: CBC    Component Value Date/Time   WBC 8.4 10/11/2014 0600   RBC 3.82* 10/11/2014 0600   HGB 11.2* 10/11/2014 0600   HCT 33.1* 10/11/2014 0600   PLT 119* 10/11/2014 0600   MCV 86.6 10/11/2014 0600   MCH 29.3 10/11/2014 0600   MCHC 33.8 10/11/2014 0600   RDW 15.8* 10/11/2014  0600   LYMPHSABS 1.6 09/02/2014 1554   MONOABS 0.9 09/02/2014 1554   EOSABS 0.2 09/02/2014 1554   BASOSABS 0.1 09/02/2014 1554    BMET    Component Value Date/Time   NA 135 10/11/2014 0600   K 3.6 10/11/2014 0600   CL 97 10/11/2014 0600   CO2 30 10/11/2014 0600   GLUCOSE 141* 10/11/2014 0600   BUN 11 10/11/2014 0600   CREATININE 0.66 10/11/2014 0600   CALCIUM 8.5 10/11/2014 0600   GFRNONAA 82* 10/11/2014 0600   GFRAA >90 10/11/2014 0600    CMP     Component Value Date/Time   NA 135 10/11/2014 0600  K 3.6 10/11/2014 0600   CL 97 10/11/2014 0600   CO2 30 10/11/2014 0600   GLUCOSE 141* 10/11/2014 0600   BUN 11 10/11/2014 0600   CREATININE 0.66 10/11/2014 0600   CALCIUM 8.5 10/11/2014 0600   PROT 6.2 12/15/2013 1546   ALBUMIN 3.1* 12/15/2013 1546   AST 23 12/15/2013 1546   ALT 16 12/15/2013 1546   ALKPHOS 149* 12/15/2013 1546   BILITOT 1.3* 12/15/2013 1546   GFRNONAA 82* 10/11/2014 0600   GFRAA >90 10/11/2014 0600   12/28 CXR IMPRESSION: Worsening interstitial and alveolar edema consistent with CHF. Underlying pneumonia is not excluded.    Total Time: 70 minutes Greater than 50%  of this time was spent counseling and coordinating care related to the above assessment and plan.   Orvis BrillAaron J. Bronwen Pendergraft D.O. Palliative Medicine Team at [facility]  Pager: [phone] Team Phone: [phone]

## 2014-10-11 NOTE — Progress Notes (Signed)
PULMONARY / CRITICAL CARE MEDICINE   Name: Sarah BirchwoodHelen Kirk MRN: [id number] DOB: 1933/03/08    ADMISSION DATE:  10/10/2014  REF[facility] :  [facility]  CHIEF COMPLAINT:  Short of breath  INITIAL PRESENTATION:  80 [facility] [place] [facility] with dyspnea, confusion, tachycardia from PNA, CHF, A fib with RVR.  She is followed by Dr. Sherene Kirk [facility]  on 3 liters oxygen. .  She has hx of recurrent UTI's and had recent admission for Pseudomonal UTI. She is frequently on prednisone for her breathing and antibiotics for UTI's.  Her daughter says that she was in hospital recently for Pseudomonal UTI with sepsis, and sent to rehab.  She was noted to have decreased appetite and confusion at rehab.  Her oxygen level was in the 70's.  [facility]  STUDIES:    SIGNIFICANT EVENTS: 12/27 [facility]   SUBJECTIVE: difficult to arouse On venti mask On cardizem gtt  VITAL SIGNS: Temp:  [97.6 F (36.4 C)-98.1 F (36.7 C)] 97.9 F (36.6 C) (12/28 0700) Pulse Rate:  [66-133] 85 (12/28 0700) Resp:  [13-27] 13 (12/28 0700) BP: (110-163)/(67-98) 127/67 mmHg (12/28 0700) SpO2:  [86 %-100 %] 97 % (12/28 0954) FiO2 (%):  [40 %-55 %] 40 % (12/28 0954) Weight:  [85.276 kg (188 lb)] 85.276 kg (188 lb) (12/27 1300) INTAKE / OUTPUT:  Intake/Output Summary (Last 24 hours) at 10/11/14 1041 Last data filed at 10/11/14 0900  Gross per 24 hour  Intake 619.91 ml  Output   1750 ml  Net -1130.09 ml    PHYSICAL EXAMINATION: General:  Ill appearing, moaning, head slumped over Neuro:  Somnolent, wakes up with stimulation, follows simple commands, mumbles, moves extremities HEENT:  venti  mask on Cardiovascular:  Irregular, rate controlled Lungs:  B/l rales Abdomen:  Obese, soft, non tender Musculoskeletal:  2+ edema Skin:  Multiple areas of ecchymoses  LABS:  CBC  Recent Labs Lab 10/11/14 0600  WBC 8.4  HGB 11.2*  HCT 33.1*   PLT 119*   Coag's No results for input(s): APTT, INR in the last 168 hours. BMET  Recent Labs Lab 10/10/14 2145 10/11/14 0600  NA  --  135  K  --  3.6  CL  --  97  CO2  --  30  BUN  --  11  CREATININE 0.77 0.66  GLUCOSE  --  141*   Electrolytes  Recent Labs Lab 10/11/14 0600  CALCIUM 8.5   Sepsis Markers No results for input(s): LATICACIDVEN, PROCALCITON, O2SATVEN in the last 168 hours. ABG No results for input(s): PHART, PCO2ART, PO2ART in the last 168 hours. Liver Enzymes No results for input(s): AST, ALT, ALKPHOS, BILITOT, ALBUMIN in the last 168 hours. Cardiac Enzymes No results for input(s): TROPONINI, PROBNP in the last 168 hours. Glucose  Recent Labs Lab 10/10/14 1716 10/10/14 1927 10/10/14 2346 10/11/14 0330 10/11/14 0829  GLUCAP 115* 131* 164* 131* 161*    Imaging No results found.  Labs, CXR from [facility] reviewed  ASSESSMENT / PLAN:  PULMONARY A: Acute on chronic respiratory failure from HCAP, CHF with hx of pulmonary fibrosis. DNR/DNI. P:   Oxygen to keep SpO2 88 to 94% BiPAP prn F/u CXR Scheduled BDs Solumedrol Morphine prn for dyspnea  CARDIOVASCULAR Rt IJ CVL 12/26 [facility]([place]) >> A:  A fib with RVR. Acute on chronic diastolic dysfx. P:  Lasix 40 q 12h Lopressor 5 mg IV q6h -taper off cardizem Monitor hemodynamics  RENAL  A:  No acute issues. P:   Monitor renal fx, urine outpt, electrolytes  GASTROINTESTINAL A:   Nutrition. Hx of GERD. P:   NPO until respiratory status better Protonix for SUP  HEMATOLOGIC A:   Leukocytosis. P:  F/u CBC Lovenox for DVT prevention  INFECTIOUS A:   Sepsis. HCAP. Recurrent UTIs. P:   Day 1 vancomycin, cefepime  Blood 12/27 >>ng Urine 12/27 >>  ENDOCRINE A:   DM type II.   P:   SSI  NEUROLOGIC A:   Acute encephalopathy 2nd to sepsis, respiratory failure. P:   Monitor mental status   TODAY'S SUMMARY:   if her medical status continues to get worse, will then  likely transition to comfort measures.   Cyril Mourningakesh [name] MD. Tonny BollmanFCCP. [place] [facility] Pager [phone] If no response call [phone]    10/11/2014, 10:41 AM

## 2014-10-12 LAB — BASIC METABOLIC PANEL
ANION GAP: 5 (ref 5–15)
BUN: 10 mg/dL (ref 6–23)
CALCIUM: 8.4 mg/dL (ref 8.4–10.5)
CO2: 36 mmol/L — AB (ref 19–32)
Chloride: 94 mEq/L — ABNORMAL LOW (ref 96–112)
Creatinine, Ser: 0.59 mg/dL (ref 0.50–1.10)
GFR calc Af Amer: >90 mL/min (ref >90)
GFR, EST NON AFRICAN AMERICAN: 85 mL/min — AB (ref >90)
Glucose, Bld: 110 mg/dL — ABNORMAL HIGH (ref 70–99)
Potassium: 2.5 mmol/L — CL (ref 3.5–5.1)
SODIUM: 135 mmol/L (ref 135–145)

## 2014-10-12 LAB — GLUCOSE, CAPILLARY
GLUCOSE-CAPILLARY: 118 mg/dL — AB (ref 70–99)
GLUCOSE-CAPILLARY: 158 mg/dL — AB (ref 70–99)
GLUCOSE-CAPILLARY: 137 mg/dL — AB (ref 70–99)
Glucose-Capillary: 162 mg/dL — ABNORMAL HIGH (ref 70–99)
Glucose-Capillary: 119 mg/dL — ABNORMAL HIGH (ref 70–99)
Glucose-Capillary: 150 mg/dL — ABNORMAL HIGH (ref 70–99)
Glucose-Capillary: 138 mg/dL — ABNORMAL HIGH (ref 70–99)

## 2014-10-12 MED ORDER — POTASSIUM CHLORIDE 10 MEQ/50ML IV SOLN
10.0000 meq | INTRAVENOUS | Status: AC
  Administered 2014-10-12 (×6): 10 meq via INTRAVENOUS
  Filled 2014-10-12 (×6): qty 50

## 2014-10-12 MED ORDER — MORPHINE SULFATE 2 MG/ML IJ SOLN
0.5000 mg | INTRAMUSCULAR | Status: DC | PRN
  Administered 2014-10-12 – 2014-10-16 (×4): 1 mg via INTRAVENOUS
  Administered 2014-10-21: 0.5 mg via INTRAVENOUS
  Administered 2014-10-22: 1 mg via INTRAVENOUS
  Administered 2014-10-23: 0.5 mg via INTRAVENOUS
  Filled 2014-10-12 (×8): qty 1

## 2014-10-12 MED ORDER — METOPROLOL TARTRATE 1 MG/ML IV SOLN
2.5000 mg | INTRAVENOUS | Status: DC | PRN
  Administered 2014-10-16: 5 mg via INTRAVENOUS
  Filled 2014-10-12: qty 5

## 2014-10-12 MED ORDER — METHYLPREDNISOLONE SODIUM SUCC 40 MG IJ SOLR
40.0000 mg | INTRAMUSCULAR | Status: DC
  Administered 2014-10-13 – 2014-10-14 (×2): 40 mg via INTRAVENOUS
  Filled 2014-10-12 (×3): qty 1

## 2014-10-12 NOTE — Progress Notes (Signed)
eLink Physician-Brief Progress Note Patient Name: Sarah BirchwoodHelen Cruces DOB: August 25, 1933 MRN: [id number]   Date of Service  10/12/2014  HPI/Events of Note    eICU Interventions  Potassium replaced IV     Intervention Category Intermediate Interventions: Electrolyte abnormality - evaluation and management  BYRUM,ROBERT S. 10/12/2014, 6:19 AM

## 2014-10-12 NOTE — Progress Notes (Signed)
CRITICAL VALUE ALERT  Critical value received:  K 2.5  Date of notification:  10/12/14  Time of notification:  0615  Critical value read back:Yes.    Nurse who received alert:  D. Madelin Rearillon, RN  MD notified (1st page):  Dr. Delton CoombesByrum  Time of first page:  0618  MD notified (2nd page):  Time of second page:  Responding MD:  Dr. Delton CoombesByrum  Time MD responded:  [phone]

## 2014-10-12 NOTE — Progress Notes (Signed)
PULMONARY / CRITICAL CARE MEDICINE   Name: Sarah BirchwoodHelen Kirk MRN: [id number] DOB: 14-Jun-1933    ADMISSION DATE:  10/10/2014  [facility] :  [facility]  CHIEF COMPLAINT:  Short of breath  INITIAL PRESENTATION:  80 [place] with dyspnea, confusion, tachycardia from PNA, CHF, A fib with RVR.  She is followed by Dr. Sherene [facility]ert [facility]. .  She has hx of recurrent UTI's and had recent admission for Pseudomonal UTI. She is frequently on prednisone for her breathing and antibiotics for UTI's.  Her daughter says that she was in hospital recently for Pseudomonal UTI with sepsis, and sent to rehab.  She was noted to have decreased appetite and confusion at rehab.  Her oxygen level was in the 70's.  [facility]  STUDIES:   SIGNIFICANT EVENTS: 12/27 Transfer [place]   SUBJECTIVE: difficult to arouse On venti mask Onff cardizem gtt  VITAL SIGNS: Temp:  [97.4 F (36.3 C)-97.9 F (36.6 C)] 97.4 F (36.3 C) (12/29 0800) Pulse Rate:  [61-85] 85 (12/29 0635) Resp:  [12-25] 14 (12/29 0635) BP: (122-157)/(60-92) 157/63 mmHg (12/29 0635) SpO2:  [93 %-99 %] 96 % (12/29 0940) FiO2 (%):  [40 %-45 %] 45 % (12/29 0940) INTAKE / OUTPUT:  Intake/Output Summary (Last 24 hours) at 10/12/14 1012 Last data filed at 10/12/14 0800  Gross per 24 hour  Intake    445 ml  Output   3025 ml  Net  -2580 ml    PHYSICAL EXAMINATION: General:  Ill appearing, head slumped over Neuro:  Somnolent, wakes up with stimulation, follows simple commands, mumbles, moves extremities HEENT:  venti  mask on Cardiovascular:  Irregular, rate controlled Lungs:  B/l rales Abdomen:  Obese, soft, non tender Musculoskeletal:  2+ edema Skin:  Multiple areas of ecchymoses  LABS:  CBC  Recent Labs Lab 10/11/14 0600  WBC 8.4  HGB 11.2*  HCT 33.1*  PLT 119*   Coag's No results for input(s): APTT, INR in the last 168  hours. BMET  Recent Labs Lab 10/10/14 2145 10/11/14 0600 10/12/14 0530  NA  --  135 135  K  --  3.6 2.5*  CL  --  97 94*  CO2  --  30 36*  BUN  --  11 10  CREATININE 0.77 0.66 0.59  GLUCOSE  --  141* 110*   Electrolytes  Recent Labs Lab 10/11/14 0600 10/12/14 0530  CALCIUM 8.5 8.4   Sepsis Markers No results for input(s): LATICACIDVEN, PROCALCITON, O2SATVEN in the last 168 hours. ABG No results for input(s): PHART, PCO2ART, PO2ART in the last 168 hours. Liver Enzymes No results for input(s): AST, ALT, ALKPHOS, BILITOT, ALBUMIN in the last 168 hours. Cardiac Enzymes No results for input(s): TROPONINI, PROBNP in the last 168 hours. Glucose  Recent Labs Lab 10/11/14 1147 10/11/14 1724 10/11/14 2014 10/11/14 2347 10/12/14 0411 10/12/14 0902  GLUCAP 120* 131* 149* 162* 118* 119*    Imaging Dg Chest Port 1 View  10/11/2014   CLINICAL DATA:  Congestive heart failure  EXAM: PORTABLE CHEST - 1 VIEW  COMPARISON:  Portable chest x-ray of October 09, 2014  FINDINGS: The interstitial markings have become more confluent. The cardiac silhouette is obscured. The pulmonary vascularity is indistinct. The right internal jugular venous catheter tip projects over the junction of the SVC with the right atrium. Small amounts of pleural fluid blunt the costophrenic angles.  IMPRESSION: Worsening interstitial and  alveolar edema consistent with CHF. Underlying pneumonia is not excluded.   Electronically Signed   By: David  Swazil[place]Jordan   On: 10/11/2014 07:48    Labs, [place] reviewed  ASSESSMENT / PLAN:  PULMONARY A: Acute on chronic respiratory failure from HCAP, CHF with hx of pulmonary fibrosis. DNR/DNI. P:   Oxygen to keep SpO2 88 to 94% Dc BiPAP  F/u CXR in am Scheduled BDs Solumedrol 40 q 24 Morphine prn for dyspnea  CARDIOVASCULAR Rt IJ CVL 12/26 Duke Salvia([place]) >> A:  A fib with RVR. Acute on chronic diastolic dysfx. P:  Lasix 40 q 12h Lopressor 5 mg IV q6h  -tapered off cardizem Monitor hemodynamics  RENAL A:  No acute issues. P:   Monitor renal fx, urine outpt, electrolytes  GASTROINTESTINAL A:   Nutrition. Hx of GERD. P:   NPO until respiratory status better Protonix for SUP  HEMATOLOGIC A:   Leukocytosis. P:  F/u CBC Lovenox for DVT prevention  INFECTIOUS A:   Sepsis. HCAP. Recurrent UTIs. P:    vancomycin >>12/29 Cefepime 12/27 >>  Blood 12/27 >>ng Urine 12/27 >>ng  ENDOCRINE A:   DM type II.   P:   SSI  NEUROLOGIC A:   Acute encephalopathy 2nd to sepsis, respiratory failure. P:   Now may be due to morphine - would decrease dose   TODAY'S SUMMARY: D/w daughter - she is concerned about morphine dosing, will back off   if her medical status continues to get worse, will then likely transition to comfort measures.   Cyril Mourningakesh [name] MD. Tonny BollmanFCCP. [facility] Pager [phone] If no response call [phone]    10/12/2014, 10:12 AM

## 2014-10-12 NOTE — Progress Notes (Signed)
Patient WU:[id number]:Rahcel Ord      DOB: Sep 25, 1933      JYN:[id number]   Palliative Medicine Team at [facility] Progress Note    Subjective: Opens eyes. Does not verbalize anything meaningful to me. Can not answer any questions.     Filed Vitals:   10/12/14 1156  BP: 128/87  Pulse:   Temp: 97.4 F (36.3 C)  Resp: 16   Physical exam: GEN: Lethargic, opens eyes to tactile stim but I am unable to illicit meaningful interaction CV: Irregular LUNGS: scattered coarse sounds ABD: soft, ND EXT: edema   CBC    Component Value Date/Time   WBC 8.4 10/11/2014 0600   RBC 3.82* 10/11/2014 0600   HGB 11.2* 10/11/2014 0600   HCT 33.1* 10/11/2014 0600   PLT 119* 10/11/2014 0600   MCV 86.6 10/11/2014 0600   MCH 29.3 10/11/2014 0600   MCHC 33.8 10/11/2014 0600   RDW 15.8* 10/11/2014 0600   LYMPHSABS 1.6 09/02/2014 1554   MONOABS 0.9 09/02/2014 1554   EOSABS 0.2 09/02/2014 1554   BASOSABS 0.1 09/02/2014 1554    CMP     Component Value Date/Time   NA 135 10/12/2014 0530   K 2.5* 10/12/2014 0530   CL 94* 10/12/2014 0530   CO2 36* 10/12/2014 0530   GLUCOSE 110* 10/12/2014 0530   BUN 10 10/12/2014 0530   CREATININE 0.59 10/12/2014 0530   CALCIUM 8.4 10/12/2014 0530   PROT 6.2 12/15/2013 1546   ALBUMIN 3.1* 12/15/2013 1546   AST 23 12/15/2013 1546   ALT 16 12/15/2013 1546   ALKPHOS 149* 12/15/2013 1546   BILITOT 1.3* 12/15/2013 1546   GFRNONAA 85* 10/12/2014 0530   GFRAA >90 10/12/2014 0530     Assessment and plan: 80 yo female with PMHx of pulm fibrosis, afib, diastolic HF who [facility] with confusion, dyspnea, tachycardia  1. Code Status: DNR  2. Goals of Care: See previous documentation. Dois DavenportSandra is really struggling.  I think a lot of this is anticipatory grief.  Dois DavenportSandra has been caregiver for either her brother or mother he entire life.  She dreads thought of losing her mom and is aware at how sick she is, and may not survive  this hospitalization. Even if she were to, I think Dois DavenportSandra senses her long-term prognosis is poor with increasing frequency of hospitalizations.  Dois DavenportSandra has no other family support other than Aunt in [place]. It sounds like her personal relationships in her community are also fractured. This even includes her local church community whom she believes look at her as an Engineer, wateroutsider.  If Myriam JacobsonHelen does pass away, will need to ensure we refer her for grief counseling.  Dois DavenportSandra also has a number of psychosocial stressors.  Would SW be able to help support Dois DavenportSandra through her psychosocial concerns?   I am certain Dois DavenportSandra would not agree with a recommendation of comfort care at this point. Our team will continue to follow along and help Dois DavenportSandra work through these issues based on Shereece's trajectory. I am off service tomorrow, but my partner Dr Phillips OdorGolding will cover for me.    3. Symptom Management:  1. Dyspnea: Seems to be breathing more comfortably but still very hypoxic.  Agree with reduced dose of morphine given Dr's concern for contribution to encephalopathy 2. Acute encephalopathy- multifactorial with sepsis, resp failure. Would avoid ativan if agitation becomes issue (dghtr frustrated she was given ativan at nursing home) Could use low dose haldol PRN if needed.  4. Psychosocial/Spiritual: Myriam JacobsonHelen has 1 daughter named Dois DavenportSandra. Had a son that passed away 3 years ago and husband deceased >20 years ago. Dois DavenportSandra has her own health issues as well. They are a spiritual family and Dois DavenportSandra requests Chaplain support/prayer for her mother. I think Dois DavenportSandra would benefit from chaplain counseling as well.   Total Time: 30 minutes >50% of time spent in counseling and coordination of care regarding above.  Orvis BrillAaron J. Harinder Romas D.O. Palliative Medicine Team at [facility]  Pager: [phone] Team Phone: [phone]

## 2014-10-13 ENCOUNTER — Inpatient Hospital Stay (HOSPITAL_COMMUNITY): Payer: Medicare Other

## 2014-10-13 DIAGNOSIS — E46 Unspecified protein-calorie malnutrition: Secondary | ICD-10-CM

## 2014-10-13 LAB — CBC
HCT: 35.5 % — ABNORMAL LOW (ref 36.0–46.0)
Hemoglobin: 11.6 g/dL — ABNORMAL LOW (ref 12.0–15.0)
MCH: 28.4 pg (ref 26.0–34.0)
MCHC: 32.7 g/dL (ref 30.0–36.0)
MCV: 87 fL (ref 78.0–100.0)
PLATELETS: 134 10*3/uL — AB (ref 150–400)
RBC: 4.08 MIL/uL (ref 3.87–5.11)
RDW: 16 % — AB (ref 11.5–15.5)
WBC: 8.5 10*3/uL (ref 4.0–10.5)

## 2014-10-13 LAB — BASIC METABOLIC PANEL
ANION GAP: 5 (ref 5–15)
BUN: 11 mg/dL (ref 6–23)
CALCIUM: 8.4 mg/dL (ref 8.4–10.5)
CO2: 44 mmol/L (ref 19–32)
CREATININE: 0.67 mg/dL (ref 0.50–1.10)
Chloride: 91 mEq/L — ABNORMAL LOW (ref 96–112)
GFR calc Af Amer: >90 mL/min (ref >90)
GFR calc non Af Amer: 81 mL/min — ABNORMAL LOW (ref >90)
Glucose, Bld: 97 mg/dL (ref 70–99)
Potassium: 2.7 mmol/L — CL (ref 3.5–5.1)
SODIUM: 140 mmol/L (ref 135–145)

## 2014-10-13 LAB — GLUCOSE, CAPILLARY
GLUCOSE-CAPILLARY: 92 mg/dL (ref 70–99)
GLUCOSE-CAPILLARY: 102 mg/dL — AB (ref 70–99)
GLUCOSE-CAPILLARY: 235 mg/dL — AB (ref 70–99)
GLUCOSE-CAPILLARY: 194 mg/dL — AB (ref 70–99)
Glucose-Capillary: 43 mg/dL — CL (ref 70–99)
Glucose-Capillary: 176 mg/dL — ABNORMAL HIGH (ref 70–99)
Glucose-Capillary: 261 mg/dL — ABNORMAL HIGH (ref 70–99)

## 2014-10-13 MED ORDER — ENSURE COMPLETE PO LIQD
237.0000 mL | Freq: Two times a day (BID) | ORAL | Status: DC
  Administered 2014-10-13 – 2014-10-22 (×10): 237 mL via ORAL

## 2014-10-13 MED ORDER — POTASSIUM CHLORIDE 10 MEQ/50ML IV SOLN
10.0000 meq | INTRAVENOUS | Status: AC
  Administered 2014-10-13 (×6): 10 meq via INTRAVENOUS
  Filled 2014-10-13 (×5): qty 50

## 2014-10-13 MED ORDER — DEXTROSE 50 % IV SOLN
INTRAVENOUS | Status: AC
  Administered 2014-10-13: 50 mL
  Filled 2014-10-13: qty 50

## 2014-10-13 MED ORDER — FUROSEMIDE 10 MG/ML IJ SOLN: 40.0000 mg | Freq: Every day | INTRAMUSCULAR | Status: DC

## 2014-10-13 MED ORDER — DIAZEPAM 5 MG/ML IJ SOLN
2.5000 mg | INTRAMUSCULAR | Status: DC | PRN
  Administered 2014-10-15: 2.5 mg via INTRAVENOUS
  Filled 2014-10-13: qty 2

## 2014-10-13 MED ORDER — DEXTROSE 50 % IV SOLN
1.0000 | Freq: Once | INTRAVENOUS | Status: AC
  Administered 2014-10-13: 50 mL via INTRAVENOUS
  Filled 2014-10-13: qty 50

## 2014-10-13 MED ORDER — DEXTROSE 10 % IV SOLN
INTRAVENOUS | Status: DC
  Administered 2014-10-13: 30 mL/h via INTRAVENOUS

## 2014-10-13 MED ORDER — FUROSEMIDE 10 MG/ML IJ SOLN
40.0000 mg | Freq: Every day | INTRAMUSCULAR | Status: DC
  Administered 2014-10-13 – 2014-10-15 (×3): 40 mg via INTRAVENOUS
  Filled 2014-10-13 (×3): qty 4

## 2014-10-13 MED ORDER — ALPRAZOLAM 0.25 MG PO TABS: 0.2500 mg | ORAL_TABLET | Freq: Two times a day (BID) | ORAL | Status: DC | PRN

## 2014-10-13 NOTE — Progress Notes (Signed)
0301 Pt had 12 beat run of V-Tach. Pt is asleep, in no acute distress. Stats are in the upper 90s. Vital signs stable. Dr. Deterdine with Critical Care paged. Md informed and no new orders given. Will continue to monitor patient.

## 2014-10-13 NOTE — Progress Notes (Addendum)
Hypoglycemic Event  CBG: 43  Treatment: dextrose 50%  Symptoms: None  Follow-up CBG: Time:1135 CBG Result:173  Possible Reasons for Event: Inadequate meal intakeNPO  Comments/MD notified: Dr. Vassie LollAlva, placed on dextrose 10%    Angus Palmslark, Keonta Alsip  Remember to initiate Hypoglycemia Order Set & complete

## 2014-10-13 NOTE — Progress Notes (Signed)
PULMONARY / CRITICAL CARE MEDICINE   Name: Sarah Kirk MRN: [id number] DOB: 01/05/1933    ADMISSION DATE:  10/10/2014  REFERRING MD :  [facility]  CHIEF COMPLAINT:  Short of breath  INITIAL PRESENTATION:  80 [facility] [place] from [place] with dyspnea, confusion, tachycardia from PNA, CHF, A fib with RVR.  She is followed by Dr. Sherene [facility]iresWert [facility]. .  She has hx of recurrent UTI's and had recent admission for Pseudomonal UTI. She is frequently on prednisone for her breathing and antibiotics for UTI's.  Her daughter says that she was in hospital recently for Pseudomonal UTI with sepsis, and sent to rehab.  She was noted to have decreased appetite and confusion at rehab.  Her oxygen level was in the 70's.  [facility].  STUDIES:   SIGNIFICANT EVENTS: 12/27 [place]   SUBJECTIVE: more awake On venti mask Off cardizem gtt, slight tachy  VITAL SIGNS: Temp:  [96.1 F (35.6 C)-97.5 F (36.4 C)] 96.1 F (35.6 C) (12/30 0700) Pulse Rate:  [92-98] 95 (12/30 0306) Resp:  [14-29] 14 (12/30 0800) BP: (128-166)/(83-97) 166/97 mmHg (12/30 0800) SpO2:  [94 %-100 %] 96 % (12/30 0803) FiO2 (%):  [40 %-45 %] 40 % (12/30 0803) INTAKE / OUTPUT:  Intake/Output Summary (Last 24 hours) at 10/13/14 1029 Last data filed at 10/13/14 0856  Gross per 24 hour  Intake    530 ml  Output   1575 ml  Net  -1045 ml    PHYSICAL EXAMINATION: General:  Ill appearing,  somnolent Neuro: wakes up with stimulation, follows simple commands, mumbles, moves extremities HEENT:  venti  mask on Cardiovascular:  Irregular, rate controlled Lungs:  B/l rales Abdomen:  Obese, soft, non tender Musculoskeletal:  2+ edema Skin:  Multiple areas of ecchymoses  LABS:  CBC  Recent Labs Lab 10/11/14 0600 10/13/14 0350  WBC 8.4 8.5  HGB 11.2* 11.6*  HCT 33.1* 35.5*  PLT 119* 134*   Coag's No results for input(s): APTT, INR  in the last 168 hours. BMET  Recent Labs Lab 10/11/14 0600 10/12/14 0530 10/13/14 0350  NA 135 135 140  K 3.6 2.5* 2.7*  CL 97 94* 91*  CO2 30 36* 44*  BUN 11 10 11   CREATININE 0.66 0.59 0.67  GLUCOSE 141* 110* 97   Electrolytes  Recent Labs Lab 10/11/14 0600 10/12/14 0530 10/13/14 0350  CALCIUM 8.5 8.4 8.4   Sepsis Markers No results for input(s): LATICACIDVEN, PROCALCITON, O2SATVEN in the last 168 hours. ABG No results for input(s): PHART, PCO2ART, PO2ART in the last 168 hours. Liver Enzymes No results for input(s): AST, ALT, ALKPHOS, BILITOT, ALBUMIN in the last 168 hours. Cardiac Enzymes No results for input(s): TROPONINI, PROBNP in the last 168 hours. Glucose  Recent Labs Lab 10/12/14 1206 10/12/14 1519 10/12/14 1923 10/12/14 2323 10/13/14 0408 10/13/14 0727  GLUCAP 158* 150* 138* 137* 92 102*    Imaging No results found.  Labs, CX[place] reviewed  ASSESSMENT / PLAN:  PULMONARY A: Acute on chronic respiratory failure from HCAP, CHF with hx of pulmonary fibrosis. DNR/DNI. P:   Oxygen to keep SpO2 88 to 94% Scheduled BDs Solumedrol 40 q 24 Low dose Morphine prn for dyspnea  CARDIOVASCULAR Rt IJ CVL 12/26 [facility] Salvia() >> A:  A fib with RVR. Acute on chronic diastolic dysfx. P:  Lasix 40 q 12h Lopressor 5 mg IV q6h -tapered off cardizem Monitor hemodynamics  RENAL A:  No acute issues. P:   Monitor renal fx, urine outpt, electrolytes  GASTROINTESTINAL A:   Nutrition. Hx of GERD. P:   NPO until respiratory status better Protonix for SUP  HEMATOLOGIC A:   Leukocytosis. P:  F/u CBC Lovenox for DVT prevention  INFECTIOUS A:   Sepsis. HCAP. Recurrent UTIs. P:    vancomycin >>12/29 Cefepime 12/27 >>  Blood 12/27 >>ng Urine 12/27 >>ng  ENDOCRINE A:   DM type II.   P:   SSI  NEUROLOGIC A:   Acute encephalopathy 2nd to sepsis, respiratory failure. P:   Now may be due to morphine - would decrease dose PT  consult - maybe too optimistic here   TODAY'S SUMMARY: She appears to be rallying some  if her medical status continues to get worse, will then transition to comfort measures. Guarded prognosis   Cyril Mourningakesh [name] MD. Tonny BollmanFCCP. [facility] Pager [phone] If no response call [phone]    10/13/2014, 10:29 AM

## 2014-10-13 NOTE — Progress Notes (Signed)
CRITICAL VALUE ALERT  Critical value received:  Potassium 2.7, CO2 44  Date of notification:  10/13/2014  Time of notification:  0437  Critical value read back:Yes.    Nurse who received alert:  Melissa  MD notified (1st page):  Dr. Deterdine  Time of first page:  [phone]  MD notified (2nd page):  Time of second page:  Responding MD:  Dr. Duanne Moroneterdine  Time MD responded:  [phone]  New orders given.

## 2014-10-13 NOTE — Progress Notes (Addendum)
Palliative Care Team at [facility] Progress Note   SUBJECTIVE: Sarah Kirk cannot wake up to talk with me-she cannot even open her eyes although it appears she was trying. Her HR went up and her respirations in response to verbal stimulation. Audibile wheezing. +grimace.  OBJECTIVE: Vital Signs: BP 166/97 mmHg  Pulse 95  Temp(Src) 97.6 F (36.4 C) (Oral)  Resp 14  Ht 5\' 9"  (1.753 m)  Wt 85.276 kg (188 lb)  BMI 27.75 kg/m2  SpO2 96%   Intake and Output: 12/29 0701 - 12/30 0700 In: 430 [I.V.:230; IV Piggyback:200] Out: 1775 [Urine:1775]  Physical Exam: General: Vital signs reviewed and noted. Mostly unresponsive-extremely lethargic  Head: Normocephalic, atraumatic.  Lungs:  +wheezing, tachypnea  Heart: RRR. S1 and S2 normal without gallop,  or rubs. (+) murmur  Abdomen:  BS normoactive. Soft, Nondistended, non-tender.  No masses or organomegaly.  Extremities: +edema, bruising    Allergies  Allergen Reactions  . Tetracyclines & Related Other (See Comments)    Breaks her mouth out  . Atenolol     Worsening lung function  . Macrobid [Nitrofurantoin]     Worsening lung fx  . Metformin     Doctor doesn't want pt to take because it will scar her lungs    Medications: Scheduled Meds:  . antiseptic oral rinse  7 mL Mouth Rinse q12n4p  . ceFEPime (MAXIPIME) IV  2 g Intravenous Q12H  . chlorhexidine  15 mL Mouth Rinse BID  . enoxaparin (LOVENOX) injection  40 mg Subcutaneous Q24H  . furosemide  40 mg Intravenous Daily  . insulin aspart  0-20 Units Subcutaneous 6 times per day  . ipratropium  0.5 mg Nebulization Q6H  . levalbuterol  0.63 mg Nebulization Q6H  . methylPREDNISolone (SOLU-MEDROL) injection  40 mg Intravenous Q24H  . metoprolol  5 mg Intravenous 4 times per day  . pantoprazole (PROTONIX) IV  40 mg Intravenous Q24H    Continuous Infusions: . sodium chloride 10 mL/hr at 10/12/14 1900  . dextrose      PRN Meds: metoprolol, morphine injection,  ondansetron  Stool Softner: yes  Palliative Performance Scale: 10%   Labs: CBC    Component Value Date/Time   WBC 8.5 10/13/2014 0350   RBC 4.08 10/13/2014 0350   HGB 11.6* 10/13/2014 0350   HCT 35.5* 10/13/2014 0350   PLT 134* 10/13/2014 0350   MCV 87.0 10/13/2014 0350   MCH 28.4 10/13/2014 0350   MCHC 32.7 10/13/2014 0350   RDW 16.0* 10/13/2014 0350   LYMPHSABS 1.6 09/02/2014 1554   MONOABS 0.9 09/02/2014 1554   EOSABS 0.2 09/02/2014 1554   BASOSABS 0.1 09/02/2014 1554    CMET     Component Value Date/Time   NA 140 10/13/2014 0350   K 2.7* 10/13/2014 0350   CL 91* 10/13/2014 0350   CO2 44* 10/13/2014 0350   GLUCOSE 97 10/13/2014 0350   BUN 11 10/13/2014 0350   CREATININE 0.67 10/13/2014 0350   CALCIUM 8.4 10/13/2014 0350   PROT 6.2 12/15/2013 1546   ALBUMIN 3.1* 12/15/2013 1546   AST 23 12/15/2013 1546   ALT 16 12/15/2013 1546   ALKPHOS 149* 12/15/2013 1546   BILITOT 1.3* 12/15/2013 1546   GFRNONAA 81* 10/13/2014 0350   GFRAA >90 10/13/2014 0350   ASSESSMENT/ PLAN:  Sarah Kirk is not doing well at all. She can barely respond and mostly is "grunting" with expiration. She has not had meaningful nutrition other than Dextrose since admission. Extremely lethargic. Not able to wake-up  or focus enough to eat. She is NPO- discussed with RN, occasional periods of alertness- could try to carefully give sips of insure. Respiratory status is slowly improving on current regimen of nebs and steroids.  1. Encephalopathy, multifactorial-metabolic, hypercarbic 2. Malnutrition, not alert enough to eat 3. HCAP and UTI Sepsis 4. A. Fib w/ RVR 5. Hypoglycemia from poor oral intake  Recommendations:  1. Trial of PO intake with full supervision and caution.  2. Continue PRN Morphine- no real difference in her mental status after holding-but needs to be monitored. 3. Trial of PT and assess mobility per primary service 4. Continue to treat underlying conditions until goals can be  established- we need to continue to try to get her daughter here in person so sh can see her mothers condition and be an informed Management consultantdecision maker. 5. I looked back through her history and she has actually been on alprazolam chronically- she may be at risk for withdrawal so I will schedule a very small dose of PRN if she gets agitated or needs this for sleep.  Suspect we are at the point of shifting to comfort measures as our most humane option- appears to be declining overall with a few periods of alertness.  Will follow and try to help with her daughter and surrogate decision makers.    Time In: 3PM Time Out: 3:35pm Total Time Spent with Patient: 35 Total Overall Time: 35   Greater than 50%  of this time was spent counseling and coordinating care related to the above assessment and plan.   Edsel PetrinElizabeth L Golding, DO  10/13/2014, 3:34 PM  Please contact Palliative Medicine Team phone at [phone] for questions and concerns.

## 2014-10-13 NOTE — Progress Notes (Signed)
eLink Physician-Brief Progress Note Patient Name: Sarah BirchwoodHelen Kirk DOB: August 04, 1933 MRN: [id number]   Date of Service  10/13/2014  HPI/Events of Note  Hypokalemia  eICU Interventions  Potassium replaced     Intervention Category Major Interventions: Electrolyte abnormality - evaluation and management  Yannis Broce 10/13/2014, 4:41 AM

## 2014-10-14 LAB — GLUCOSE, CAPILLARY
GLUCOSE-CAPILLARY: 75 mg/dL (ref 70–99)
GLUCOSE-CAPILLARY: 230 mg/dL — AB (ref 70–99)
GLUCOSE-CAPILLARY: 125 mg/dL — AB (ref 70–99)
Glucose-Capillary: 107 mg/dL — ABNORMAL HIGH (ref 70–99)
Glucose-Capillary: 237 mg/dL — ABNORMAL HIGH (ref 70–99)
Glucose-Capillary: 135 mg/dL — ABNORMAL HIGH (ref 70–99)

## 2014-10-14 LAB — BASIC METABOLIC PANEL
Anion gap: 4 — ABNORMAL LOW (ref 5–15)
BUN: 17 mg/dL (ref 6–23)
CALCIUM: 9 mg/dL (ref 8.4–10.5)
CO2: 46 mmol/L — AB (ref 19–32)
Chloride: 92 mEq/L — ABNORMAL LOW (ref 96–112)
Creatinine, Ser: 0.76 mg/dL (ref 0.50–1.10)
GFR calc Af Amer: >90 mL/min (ref >90)
GFR calc non Af Amer: 78 mL/min — ABNORMAL LOW (ref >90)
GLUCOSE: 84 mg/dL (ref 70–99)
POTASSIUM: 3.3 mmol/L — AB (ref 3.5–5.1)
Sodium: 142 mmol/L (ref 135–145)

## 2014-10-14 LAB — PHOSPHORUS: PHOSPHORUS: 1.7 mg/dL — AB (ref 2.3–4.6)

## 2014-10-14 LAB — MAGNESIUM: Magnesium: 1.9 mg/dL (ref 1.5–2.5)

## 2014-10-14 MED ORDER — POTASSIUM PHOSPHATES 15 MMOLE/5ML IV SOLN
30.0000 mmol | Freq: Once | INTRAVENOUS | Status: AC
  Administered 2014-10-14: 30 mmol via INTRAVENOUS
  Filled 2014-10-14: qty 10

## 2014-10-14 MED ORDER — METHYLPREDNISOLONE SODIUM SUCC 40 MG IJ SOLR
20.0000 mg | INTRAMUSCULAR | Status: DC
  Administered 2014-10-15 – 2014-10-19 (×5): 20 mg via INTRAVENOUS
  Filled 2014-10-14 (×7): qty 0.5

## 2014-10-14 NOTE — Progress Notes (Signed)
PULMONARY / CRITICAL CARE MEDICINE   Name: Sarah BirchwoodHelen [facility] MRN: [id number] DOB: August 11, 1933    ADMISSION DATE:  10/10/2014  [facility]  CHIEF COMPLAINT:  Short of breath  INITIAL PRESENTATION:  80 yo female from [facility] with dyspnea, confusion, tachycardia from PNA, CHF, A fib with RVR.  She is followed by Dr. Sherene SiresWert for pulmonary fibrosis from [facility]  on 3 liters oxygen. .  She has hx of recurrent UTI's and had recent admission for Pseudomonal UTI. She is frequently on prednisone for her breathing and antibiotics for UTI's.  Her daughter says that she was in [place] recently for Pseudomonal UTI with sepsis, and sent to rehab.  She was noted to have decreased appetite and confusion at rehab.  Her oxygen level was in the 70's.  [facility]  STUDIES:   SIGNIFICANT EVENTS: 12/27 [facility]   SUBJECTIVE: More alert today, oriented to self only. Off cardizem gtt, slight tachy. Titrated down to 4L [place] tolerating well.  VITAL SIGNS: Temp:  [96.7 F (35.9 C)-98.7 F (37.1 C)] 96.7 F (35.9 C) (12/31 0721) Pulse Rate:  [47-114] 50 (12/31 0721) Resp:  [18-28] 19 (12/31 0721) BP: (117-152)/(58-90) 138/79 mmHg (12/31 0721) SpO2:  [91 %-100 %] 95 % (12/31 0721) FiO2 (%):  [40 %] 40 % (12/30 1600) INTAKE / OUTPUT:  Intake/Output Summary (Last 24 hours) at 10/14/14 0951 Last data filed at 10/14/14 0700  Gross per 24 hour  Intake    570 ml  Output    700 ml  Net   -130 ml    PHYSICAL EXAMINATION:  General:  Elderly female in mild distress. Neuro: Spontaneously awake, oriented to self. follows simple commands. Moves extremities. HEENT:  [place]/AT, no JVD noted.  Cardiovascular:  Irregularly irregular, tachy (110-120) Lungs:  B/l rales Abdomen:  Obese, soft, non tender Musculoskeletal:  2+ edema Skin:  Multiple areas of ecchymoses  LABS:  CBC  Recent Labs Lab 10/11/14 0600 10/13/14 0350  WBC 8.4 8.5  HGB 11.2* 11.6*   HCT 33.1* 35.5*  PLT 119* 134*   Coag's No results for input(s): APTT, INR in the last 168 hours. BMET  Recent Labs Lab 10/12/14 0530 10/13/14 0350 10/14/14 0400  NA 135 140 142  K 2.5* 2.7* 3.3*  CL 94* 91* 92*  CO2 36* 44* 46*  BUN 10 11 17   CREATININE 0.59 0.67 0.76  GLUCOSE 110* 97 84   Electrolytes  Recent Labs Lab 10/12/14 0530 10/13/14 0350 10/14/14 0400  CALCIUM 8.4 8.4 9.0  MG  --   --  1.9  PHOS  --   --  1.7*   Sepsis Markers No results for input(s): LATICACIDVEN, PROCALCITON, O2SATVEN in the last 168 hours. ABG No results for input(s): PHART, PCO2ART, PO2ART in the last 168 hours. Liver Enzymes No results for input(s): AST, ALT, ALKPHOS, BILITOT, ALBUMIN in the last 168 hours. Cardiac Enzymes No results for input(s): TROPONINI, PROBNP in the last 168 hours. Glucose  Recent Labs Lab 10/13/14 1135 10/13/14 1545 10/13/14 2113 10/13/14 2317 10/14/14 0400 10/14/14 0801  GLUCAP 176* 235* 261* 194* 75 107*    Imaging Dg Chest Port 1 View  10/13/2014   CLINICAL DATA:  Acute respiratory failure  EXAM: PORTABLE CHEST - 1 VIEW  COMPARISON:  10/11/2014; 10/09/2014; 10/03/2014; chest CT - 10/03/2014  FINDINGS: Grossly unchanged enlarged cardiac silhouette and mediastinal contours given persistently reduced lung volumes and patient rotation. Stable position of support apparatus including  right jugular approach central venous catheter overlying the right atrium. No pneumothorax. Minimally improved aeration of the left upper and right mid lung with otherwise extensive heterogeneous interstitial and airspace opacities. Unchanged small bilateral effusions, left greater than right. Unchanged bones.  IMPRESSION: 1. Minimally improved aeration along suggests resolving edema and/or atelectasis. 2. Persistent interstitial opacities worse within the right upper lung and bilateral lung bases, left greater than right with differential considerations including residual  asymmetric pulmonary edema and/or multifocal infection. Continued attention on follow-up is recommended. 3. Right jugular approach intravenous catheter tip again overlies the right atrium. Retraction approximately 6 cm would place the tip regional to the superior cavoatrial junction. No pneumothorax.   Electronically Signed   By: Simonne ComeJohn  Watts M.D.   On: 10/13/2014 08:04      ASSESSMENT / PLAN:  PULMONARY A: Acute on chronic respiratory failure from HCAP, CHF with hx of pulmonary fibrosis. DNR/DNI. P:   Oxygen to keep SpO2 88 to 94% Scheduled BDs Solumedrol 40 q 24 Low dose Morphine prn for dyspnea  CARDIOVASCULAR Rt IJ CVL 12/26 [facility] Salvia([place]) >> A:  A fib with RVR. Acute on chronic diastolic dysfx. P:  Lasix 40 q 12h Lopressor 5 mg IV q6h -tapered off cardizem Monitor hemodynamics  RENAL A:  Hypokalemia Hypophosphatemia  P:   Monitor renal fx, urine outpt, electrolytes  GASTROINTESTINAL A:   Nutrition. Hx of GERD. P:   NPO until respiratory status better Protonix for SUP Bedside swallow eval now that more awake.   HEMATOLOGIC A:   Leukocytosis. P:  F/u CBC Lovenox for DVT prevention  INFECTIOUS A:   Sepsis. HCAP. Recurrent UTIs. P:    vancomycin >>12/29 Cefepime 12/27 >>  Blood 12/27 >>ng Urine 12/27 >>ng  ENDOCRINE A:   DM type II.   P:   SSI  NEUROLOGIC A:   Acute encephalopathy 2nd to sepsis, respiratory failure. (improving) P:   Morphine decreased 12/30 PT consult pending - maybe too optimistic here    Joneen RoachPaul Hoffman, AGACNP-BC [facility] Pager [phone] or [phone]  TODAY'S SUMMARY:  Appears more awake today, but with marked confusion and mild distress of unknown cause. Palliative care eval agrees that should her condition deteriorate, comfort measures would be an appropriate course of action. They are attempting to set meeting with family. I updated daughter about improvement but guarded prognosis overall    Oretha MilchALVA,RAKESH V. MD

## 2014-10-14 NOTE — Progress Notes (Signed)
Medicare Important Message given? YES  (If response is "NO", the following Medicare IM given date fields will be blank)  Date Medicare IM given: 10/14/14 Medicare IM given by:  Ebrima Ranta  

## 2014-10-14 NOTE — Progress Notes (Signed)
ANTIBIOTIC CONSULT NOTE - FOLLOW UP  Pharmacy Consult:  Cefepime Indication:  HCAP  Allergies  Allergen Reactions  . Tetracyclines & Related Other (See Comments)    Breaks her mouth out  . Atenolol     Worsening lung function  . Macrobid [Nitrofurantoin]     Worsening lung fx  . Metformin     Doctor doesn't want pt to take because it will scar her lungs    Patient Measurements: Height: 5\' 9"  (175.3 cm) Weight: 188 lb (85.276 kg) IBW/kg (Calculated) : 66.2  Vital Signs: Temp: 96.7 F (35.9 C) (12/31 0721) Temp Source: Axillary (12/31 0721) BP: 138/79 mmHg (12/31 0721) Pulse Rate: 50 (12/31 0721) Intake/Output from previous day: 12/30 0701 - 12/31 0700 In: 690 [P.O.:120; I.V.:210; IV Piggyback:200] Out: 875 [Urine:875]  Labs:  Recent Labs  10/12/14 0530 10/13/14 0350 10/14/14 0400  WBC  --  8.5  --   HGB  --  11.6*  --   PLT  --  134*  --   CREATININE 0.59 0.67 0.76   Estimated Creatinine Clearance: 66.4 mL/min (by C-G formula based on Cr of 0.76). No results for input(s): VANCOTROUGH, VANCOPEAK, VANCORANDOM, GENTTROUGH, GENTPEAK, GENTRANDOM, TOBRATROUGH, TOBRAPEAK, TOBRARND, AMIKACINPEAK, AMIKACINTROU, AMIKACIN in the last 72 hours.   Microbiology: Recent Results (from the past 720 hour(s))  MRSA PCR Screening     Status: None   Collection Time: 10/10/14  2:37 PM  Result Value Ref Range Status   MRSA by PCR NEGATIVE NEGATIVE Final    Comment:        The GeneXpert MRSA Assay (FDA approved for NASAL specimens only), is one component of a comprehensive MRSA colonization surveillance program. It is not intended to diagnose MRSA infection nor to guide or monitor treatment for MRSA infections.   Culture, blood (routine x 2)     Status: None (Preliminary result)   Collection Time: 10/10/14  4:27 PM  Result Value Ref Range Status   Specimen Description BLOOD RIGHT HAND  Final   Special Requests BOTTLES DRAWN AEROBIC ONLY 5CC  Final   Culture   Final       BLOOD CULTURE RECEIVED NO GROWTH TO DATE CULTURE WILL BE HELD FOR 5 DAYS BEFORE ISSUING A FINAL NEGATIVE REPORT Performed at [facility]    Report Status PENDING  Incomplete  Culture, blood (routine x 2)     Status: None (Preliminary result)   Collection Time: 10/10/14  4:50 PM  Result Value Ref Range Status   Specimen Description BLOOD RIGHT HAND  Final   Special Requests BOTTLES DRAWN AEROBIC ONLY 4CC  Final   Culture   Final           BLOOD CULTURE RECEIVED NO GROWTH TO DATE CULTURE WILL BE HELD FOR 5 DAYS BEFORE ISSUING A FINAL NEGATIVE REPORT Performed at [facility]    Report Status PENDING  Incomplete  Urine culture     Status: None   Collection Time: 10/10/14  5:45 PM  Result Value Ref Range Status   Specimen Description URINE, CATHETERIZED  Final   Special Requests NONE  Final   Colony Count NO GROWTH Performed at [facility]   Final   Culture NO GROWTH Performed at [facility]   Final   Report Status 10/11/2014 FINAL  Final      Assessment: 81 YOF continues on Cefepime for HCAP.  Patient's renal function has been stable.  Noted plan to meet with family to decide on  goals of care.  Vanc 12/27 >> 12/29 Cefepime 12/27 >> Cefdinir/Septra PTA  12/27 BCx x2 - NGTD 12/27 UCx - negative   Goal of Therapy:  Clearance of infection   Plan:  - Continue cefepime 2gm IV Q12H - Monitor renal fxn, clinical progress - F/U resume home meds if aggressive care to continue    Kurstyn Larios D. Laney Potashang, PharmD, BCPS Pager:  [phone] 10/14/2014, 10:27 AM

## 2014-10-14 NOTE — Progress Notes (Signed)
Chaplain attempted to complete consult for daughter requesting prayer. Pt sleeping and no family was present in room. Chaplain offered silent prayer for patient. Should family arrive and need support, contact on-call chaplain [phone]. Tymir Terral, Mayer MaskerCourtney F, Chaplain 10/14/2014 11:31 AM

## 2014-10-14 NOTE — Progress Notes (Signed)
eLink Physician-Brief Progress Note Patient Name: [facility] DOB: June 27, 1933 MRN: [id number]   Date of Service  10/14/2014  HPI/Events of Note  Hypokalemia and hypophosphatemia  eICU Interventions  Potassium and phos replaced     Intervention Category Intermediate Interventions: Electrolyte abnormality - evaluation and management  Mikkel Charrette 10/14/2014, 6:46 AM

## 2014-10-14 NOTE — Clinical Social Work Note (Signed)
CSW Consult Acknowledged:   CSW recevied consult for crisis intervention. CSW spoke with the pt daughter Dois DavenportSandra. Dois DavenportSandra reported that she is not ready to accept the clinical recommendation regarding her mother health. Dois DavenportSandra reported that she will not give up on her mother and will not make he total comfort care. Please reconsult if needed again.   Reuben Knoblock, MSW, LCSWA [phone]

## 2014-10-15 LAB — BASIC METABOLIC PANEL
Anion gap: 7 (ref 5–15)
BUN: 19 mg/dL (ref 6–23)
CO2: 47 mmol/L (ref 19–32)
CREATININE: 0.72 mg/dL (ref 0.50–1.10)
Calcium: 8.6 mg/dL (ref 8.4–10.5)
Chloride: 87 mEq/L — ABNORMAL LOW (ref 96–112)
GFR calc Af Amer: >90 mL/min (ref >90)
GFR, EST NON AFRICAN AMERICAN: 80 mL/min — AB (ref >90)
Glucose, Bld: 143 mg/dL — ABNORMAL HIGH (ref 70–99)
Potassium: 3.1 mmol/L — ABNORMAL LOW (ref 3.5–5.1)
SODIUM: 141 mmol/L (ref 135–145)

## 2014-10-15 LAB — CBC
HEMATOCRIT: 35.2 % — AB (ref 36.0–46.0)
Hemoglobin: 11.4 g/dL — ABNORMAL LOW (ref 12.0–15.0)
MCH: 28.8 pg (ref 26.0–34.0)
MCHC: 32.4 g/dL (ref 30.0–36.0)
MCV: 88.9 fL (ref 78.0–100.0)
PLATELETS: 156 10*3/uL (ref 150–400)
RBC: 3.96 MIL/uL (ref 3.87–5.11)
RDW: 15.5 % (ref 11.5–15.5)
WBC: 8.8 10*3/uL (ref 4.0–10.5)

## 2014-10-15 LAB — GLUCOSE, CAPILLARY
GLUCOSE-CAPILLARY: 198 mg/dL — AB (ref 70–99)
GLUCOSE-CAPILLARY: 244 mg/dL — AB (ref 70–99)
GLUCOSE-CAPILLARY: 186 mg/dL — AB (ref 70–99)
Glucose-Capillary: 137 mg/dL — ABNORMAL HIGH (ref 70–99)
Glucose-Capillary: 122 mg/dL — ABNORMAL HIGH (ref 70–99)
Glucose-Capillary: 212 mg/dL — ABNORMAL HIGH (ref 70–99)

## 2014-10-15 LAB — PHOSPHORUS: Phosphorus: 2.4 mg/dL (ref 2.3–4.6)

## 2014-10-15 MED ORDER — POTASSIUM CHLORIDE 20 MEQ PO PACK
40.0000 meq | PACK | Freq: Once | ORAL | Status: DC
  Filled 2014-10-15: qty 2

## 2014-10-15 MED ORDER — POTASSIUM CHLORIDE CRYS ER 20 MEQ PO TBCR
40.0000 meq | EXTENDED_RELEASE_TABLET | Freq: Once | ORAL | Status: AC
  Administered 2014-10-15: 40 meq via ORAL
  Filled 2014-10-15: qty 2

## 2014-10-15 MED ORDER — FUROSEMIDE 10 MG/ML IJ SOLN
20.0000 mg | Freq: Every day | INTRAMUSCULAR | Status: DC
  Administered 2014-10-16 – 2014-10-17 (×2): 20 mg via INTRAVENOUS
  Filled 2014-10-15 (×2): qty 2

## 2014-10-15 NOTE — Progress Notes (Signed)
PULMONARY / CRITICAL CARE MEDICINE   Name: [facility] MRN: [id number] DOB: 11-10-1932    ADMISSION DATE:  10/10/2014  REFERRING MD :  [facility]  CHIEF COMPLAINT:  Short of breath  INITIAL PRESENTATION:  81 yo female from [place] with dyspnea, confusion, tachycardia from PNA, CHF, A fib with RVR.  She is followed by [name] for pulmonary fibrosis from [facility]  on 3 liters oxygen. .  She has hx of recurrent UTI's and had recent admission for Pseudomonal UTI. She is frequently on prednisone for her breathing and antibiotics for UTI's.  Her daughter says that she was in hospital recently for Pseudomonal UTI with sepsis, and sent to rehab.  She was noted to have decreased appetite and confusion at rehab.  Her oxygen level was in the 70's.  She was sent to [facility].  STUDIES:   SIGNIFICANT EVENTS: 12/27 Transfer from [place]   SUBJECTIVE:  Alert this am, but not overly communicative.  No increased wob.  VITAL SIGNS: Temp:  [96.8 F (36 C)-98.5 F (36.9 C)] 98.5 F (36.9 C) (01/01 1202) Pulse Rate:  [98-116] 113 (01/01 1202) Resp:  [15-23] 23 (01/01 1202) BP: (121-146)/(67-97) 139/82 mmHg (01/01 1202) SpO2:  [94 %-100 %] 100 % (01/01 1202) INTAKE / OUTPUT:  Intake/Output Summary (Last 24 hours) at 10/15/14 1230 Last data filed at 10/15/14 1202  Gross per 24 hour  Intake  435.5 ml  Output   2475 ml  Net -2039.5 ml    PHYSICAL EXAMINATION:  General:  Elderly female with no increased wob Neuro: Spontaneously awake, moves all 4, tries to answer questions HEENT:  [place]/AT, no TMG/LN  Cardiovascular:  Irregular, CVR, no murmur Lungs:  Scattered crackles, no wheezing Abdomen:  Obese, soft, non tender Musculoskeletal:  2+ edema  LABS:  CBC  Recent Labs Lab 10/11/14 0600 10/13/14 0350 10/15/14 0415  WBC 8.4 8.5 8.8  HGB 11.2* 11.6* 11.4*  HCT 33.1* 35.5* 35.2*  PLT 119* 134* 156   Coag's No results for input(s): APTT, INR in the last 168  hours. BMET  Recent Labs Lab 10/13/14 0350 10/14/14 0400 10/15/14 0415  NA 140 142 141  K 2.7* 3.3* 3.1*  CL 91* 92* 87*  CO2 44* 46* 47*  BUN CREATININE 0.67 0.76 0.72  GLUCOSE 97 84 143*      ASSESSMENT / PLAN:  PULMONARY A: Acute on chronic respiratory failure from HCAP, CHF with hx of pulmonary fibrosis. DNR/DNI. P:   Oxygen to keep SpO2 88 to 94% Scheduled BDs Continue solumedrol, but change to oral steroids if remains stable. Low dose Morphine prn for dyspnea  CARDIOVASCULAR Rt IJ CVL 12/26 [facility]) >> A:  A fib with RVR. Acute on chronic diastolic dysfx. P:  Decrease lasix with rising bicarb level Lopressor 5 mg IV q6h -tapered off cardizem   HEMATOLOGIC A:   Leukocytosis. P:  F/u CBC Lovenox for DVT prevention  INFECTIOUS A:   Sepsis. HCAP. Recurrent UTIs. P:    vancomycin >>12/29 Cefepime 12/27 >>  Blood 12/27 >>ng Urine 12/27 >>ng  ENDOCRINE A:   DM type II.   P:   SSI  NEUROLOGIC A:   Acute encephalopathy 2nd to sepsis, respiratory failure. (improving) P:   Morphine decreased 12/30 PT consult pending - maybe too optimistic here

## 2014-10-15 NOTE — Evaluation (Signed)
Physical Therapy Evaluation Patient Details Name: Sarah [facility] MRN: [id number] DOB: Dec 10, 1932 Today's Date: 10/15/2014   History of Present Illness  pt presents with Respiratory Failure and Hypoxia with HCAP, CHF, and Pulmonary fibrosis.    Clinical Impression  Pt without participation in bed mobility today and seems to get anxious easily.  Unclear pt's baseline level of mobility or cognition and no family present.  At this time feel pt will need SNF level of care and will trial PT while on acute.    Follow Up Recommendations SNF    Equipment Recommendations  None recommended by PT    Recommendations for Other Services       Precautions / Restrictions Precautions Precautions: Fall Precaution Comments: Watch O2 sats.   Restrictions Weight Bearing Restrictions: No      Mobility  Bed Mobility Overal bed mobility: Needs Assistance;+2 for physical assistance Bed Mobility: Rolling Rolling: Total assist;+2 for physical assistance         General bed mobility comments: pt not participating in mobility despite cues to attempt to assist.  O2 sats remained in low 90's during bed mobility.    Transfers                    Ambulation/Gait                Stairs            Wheelchair Mobility    Modified Rankin (Stroke Patients Only)       Balance                                             Pertinent Vitals/Pain Pain Assessment: Faces Faces Pain Scale: Hurts even more Pain Location: Unable to state, but seems mroe anxious than painful. Pain Intervention(s): Monitored during session;Repositioned    Home Living Family/patient expects to be discharged to:: Skilled nursing facility                      Prior Function           Comments: pt only oriented to self and unclear PLOF.       Hand Dominance        Extremity/Trunk Assessment   Upper Extremity Assessment: Generalized weakness           Lower  Extremity Assessment: Generalized weakness      Cervical / Trunk Assessment: Kyphotic  Communication   Communication: No difficulties  Cognition Arousal/Alertness: Awake/alert Behavior During Therapy: Flat affect Overall Cognitive Status: No family/caregiver present to determine baseline cognitive functioning Area of Impairment: Orientation;Attention;Memory;Following commands;Safety/judgement;Awareness;Problem solving Orientation Level: Disoriented to;Place;Time;Situation Current Attention Level: Focused           General Comments: Unclear pt's baseline level of cognition, but at this time she is only oriented to self.      General Comments      Exercises        Assessment/Plan    PT Assessment Patient needs continued PT services  PT Diagnosis Generalized weakness   PT Problem List Decreased strength;Decreased activity tolerance;Decreased balance;Decreased mobility;Decreased coordination;Decreased cognition;Decreased knowledge of use of DME;Cardiopulmonary status limiting activity  PT Treatment Interventions DME instruction;Functional mobility training;Therapeutic activities;Therapeutic exercise;Balance training;Cognitive remediation;Patient/family education   PT Goals (Current goals can be found in the Care Plan section) Acute Rehab PT Goals Patient Stated Goal: Unable to  state.   PT Goal Formulation: Patient unable to participate in goal setting Time For Goal Achievement: 10/29/14 Potential to Achieve Goals: Fair    Frequency Min 2X/week   Barriers to discharge Other (comment) No family present and unclear level of A available.      Co-evaluation               End of Session Equipment Utilized During Treatment: Oxygen Activity Tolerance: Patient limited by fatigue Patient left: in bed;with call bell/phone within reach Nurse Communication: Mobility status         Time: 5643-3295 PT Time Calculation (min) (ACUTE ONLY): 15 min   Charges:   PT  Evaluation $Initial PT Evaluation Tier I: 1 Procedure PT Treatments $Therapeutic Activity: 8-22 mins   PT G CodesSunny Kirk, [name] [phone] 10/15/2014, 1:14 PM

## 2014-10-16 DIAGNOSIS — I482 Chronic atrial fibrillation: Secondary | ICD-10-CM

## 2014-10-16 LAB — BASIC METABOLIC PANEL
Anion gap: 11 (ref 5–15)
BUN: 18 mg/dL (ref 6–23)
CHLORIDE: 85 meq/L — AB (ref 96–112)
CO2: 41 mmol/L (ref 19–32)
CREATININE: 0.72 mg/dL (ref 0.50–1.10)
Calcium: 8.9 mg/dL (ref 8.4–10.5)
GFR calc Af Amer: >90 mL/min (ref >90)
GFR calc non Af Amer: 80 mL/min — ABNORMAL LOW (ref >90)
GLUCOSE: 115 mg/dL — AB (ref 70–99)
Potassium: 3.6 mmol/L (ref 3.5–5.1)
Sodium: 137 mmol/L (ref 135–145)

## 2014-10-16 LAB — GLUCOSE, CAPILLARY
GLUCOSE-CAPILLARY: 102 mg/dL — AB (ref 70–99)
GLUCOSE-CAPILLARY: 106 mg/dL — AB (ref 70–99)
GLUCOSE-CAPILLARY: 131 mg/dL — AB (ref 70–99)
Glucose-Capillary: 171 mg/dL — ABNORMAL HIGH (ref 70–99)
Glucose-Capillary: 146 mg/dL — ABNORMAL HIGH (ref 70–99)
Glucose-Capillary: 158 mg/dL — ABNORMAL HIGH (ref 70–99)

## 2014-10-16 MED ORDER — CETYLPYRIDINIUM CHLORIDE 0.05 % MT LIQD
7.0000 mL | Freq: Two times a day (BID) | OROMUCOSAL | Status: DC
  Administered 2014-10-16 – 2014-10-22 (×14): 7 mL via OROMUCOSAL

## 2014-10-16 MED ORDER — POTASSIUM CHLORIDE CRYS ER 20 MEQ PO TBCR
40.0000 meq | EXTENDED_RELEASE_TABLET | Freq: Once | ORAL | Status: AC
  Administered 2014-10-16: 40 meq via ORAL
  Filled 2014-10-16: qty 2

## 2014-10-16 NOTE — Progress Notes (Signed)
PULMONARY / CRITICAL CARE MEDICINE   Name: Sarah [facility] MRN: [id number] DOB: Oct 10, 1933    ADMISSION DATE:  10/10/2014  REFERRING MD :  [facility]  CHIEF COMPLAINT:  Short of breath  INITIAL PRESENTATION:  81 yo female from [place] with dyspnea, confusion, tachycardia from PNA, CHF, A fib with RVR.  She is followed by [name] for pulmonary fibrosis from [facility]  on 3 liters oxygen. .  She has hx of recurrent UTI's and had recent admission for Pseudomonal UTI. She is frequently on prednisone for her breathing and antibiotics for UTI's.  Her daughter says that she was in hospital recently for Pseudomonal UTI with sepsis, and sent to rehab.  She was noted to have decreased appetite and confusion at rehab.  Her oxygen level was in the 70's.  She was sent to [facility].  STUDIES:   SIGNIFICANT EVENTS: 12/27 Transfer from [place]   SUBJECTIVE:  No new events overnight.  No distress or increased wob.   VITAL SIGNS: Temp:  [96.1 F (35.6 C)-98.5 F (36.9 C)] 98.2 F (36.8 C) (01/02 0700) Pulse Rate:  [92-122] 107 (01/02 0932) Resp:  [18-23] 21 (01/02 0932) BP: (120-145)/(73-94) 145/91 mmHg (01/02 0800) SpO2:  [95 %-100 %] 97 % (01/02 0932) INTAKE / OUTPUT:  Intake/Output Summary (Last 24 hours) at 10/16/14 1135 Last data filed at 10/16/14 0729  Gross per 24 hour  Intake    170 ml  Output   1125 ml  Net   -955 ml    PHYSICAL EXAMINATION:  General:  Elderly female with no increased wob Neuro: Spontaneously awake, moves all 4, tries to answer questions HEENT:  [place]/AT, no TMG/LN  Cardiovascular:  Irregular, CVR, no murmur Lungs:  Decreased depth of inspiration, basilar crackles. Abdomen:  Obese, soft, non tender, bs+ Musculoskeletal:  2+ edema  LABS:  CBC  Recent Labs Lab 10/11/14 0600 10/13/14 0350 10/15/14 0415  WBC 8.4 8.5 8.8  HGB 11.2* 11.6* 11.4*  HCT 33.1* 35.5* 35.2*  PLT 119* 134* 156   Coag's No results for input(s): APTT, INR  in the last 168 hours. BMET  Recent Labs Lab 10/14/14 0400 10/15/14 0415 10/16/14 0429  NA 142 141 137  K 3.3* 3.1* 3.6  CL 92* 87* 85*  CO2 46* 47* 41*  BUN CREATININE 0.76 0.72 0.72  GLUCOSE 84 143* 115*      ASSESSMENT / PLAN:  PULMONARY A: Acute on chronic respiratory failure from HCAP, CHF with hx of pulmonary fibrosis. DNR/DNI. P:   Oxygen to keep SpO2 88 to 94% Scheduled BDs Continue solumedrol, but change to oral steroids if remains stable. Low dose Morphine prn for dyspnea  CARDIOVASCULAR Rt IJ CVL 12/26 [facility]) >> A:  A fib with RVR. Acute on chronic diastolic dysfx. P:  Change to oral lasix soon Lopressor 5 mg IV q6h -tapered off cardizem    INFECTIOUS A:   Sepsis. HCAP. Recurrent UTIs. P:    vancomycin >>12/29 Cefepime 12/27 >>  Blood 12/27 >>ng Urine 12/27 >>ng  ENDOCRINE A:   DM type II.   P:   SSI  NEUROLOGIC A:   Acute encephalopathy 2nd to sepsis, respiratory failure. (improving) P:   Morphine decreased 12/30

## 2014-10-16 NOTE — Progress Notes (Signed)
Daughter stopped by to visit. Has many questions concerning plan of care. MD and team notified.

## 2014-10-17 DIAGNOSIS — R34 Anuria and oliguria: Secondary | ICD-10-CM

## 2014-10-17 LAB — GLUCOSE, CAPILLARY
GLUCOSE-CAPILLARY: 99 mg/dL (ref 70–99)
GLUCOSE-CAPILLARY: 167 mg/dL — AB (ref 70–99)
Glucose-Capillary: 108 mg/dL — ABNORMAL HIGH (ref 70–99)
Glucose-Capillary: 143 mg/dL — ABNORMAL HIGH (ref 70–99)
Glucose-Capillary: 160 mg/dL — ABNORMAL HIGH (ref 70–99)

## 2014-10-17 LAB — CULTURE, BLOOD (ROUTINE X 2)
CULTURE: NO GROWTH
Culture: NO GROWTH

## 2014-10-17 MED ORDER — SODIUM CHLORIDE 0.9 % IV BOLUS (SEPSIS)
250.0000 mL | Freq: Once | INTRAVENOUS | Status: AC
  Administered 2014-10-17: 250 mL via INTRAVENOUS

## 2014-10-17 NOTE — Progress Notes (Signed)
ANTIBIOTIC CONSULT NOTE - FOLLOW UP  Pharmacy Consult:  Cefepime Indication:  HCAP  Allergies  Allergen Reactions  . Tetracyclines & Related Other (See Comments)    Breaks her mouth out  . Atenolol     Worsening lung function  . Macrobid [Nitrofurantoin]     Worsening lung fx  . Metformin     Doctor doesn't want pt to take because it will scar her lungs    Patient Measurements: Height:  (175.3 cm) Weight: 188 lb (85.276 kg) IBW/kg (Calculated) : 66.2  Vital Signs: Temp: 97.8 F (36.6 C) (01/03 0735) Temp Source: Axillary (01/03 0735) BP: 137/65 mmHg (01/03 0735) Pulse Rate: 92 (01/03 0735) Intake/Output from previous day: 01/02 0701 - 01/03 0700 In: 250 [P.O.:150; IV Piggyback:100] Out: 526 [Urine:526]  Labs:  Recent Labs  10/15/14 0415 10/16/14 0429  WBC 8.8  --   HGB 11.4*  --   PLT 156  --   CREATININE 0.72 0.72   Estimated Creatinine Clearance: 66.4 mL/min (by C-G formula based on Cr of 0.72). No results for input(s): VANCOTROUGH, VANCOPEAK, VANCORANDOM, GENTTROUGH, GENTPEAK, GENTRANDOM, TOBRATROUGH, TOBRAPEAK, TOBRARND, AMIKACINPEAK, AMIKACINTROU, AMIKACIN in the last 72 hours.   Microbiology: Recent Results (from the past 720 hour(s))  MRSA PCR Screening     Status: None   Collection Time: 10/10/14  2:37 PM  Result Value Ref Range Status   MRSA by PCR NEGATIVE NEGATIVE Final    Comment:        The GeneXpert MRSA Assay (FDA approved for NASAL specimens only), is one component of a comprehensive MRSA colonization surveillance program. It is not intended to diagnose MRSA infection nor to guide or monitor treatment for MRSA infections.   Culture, blood (routine x 2)     Status: None (Preliminary result)   Collection Time: 10/10/14  4:27 PM  Result Value Ref Range Status   Specimen Description BLOOD RIGHT HAND  Final   Special Requests BOTTLES DRAWN AEROBIC ONLY 5CC  Final   Culture   Final           BLOOD CULTURE RECEIVED NO GROWTH TO DATE  CULTURE WILL BE HELD FOR 5 DAYS BEFORE ISSUING A FINAL NEGATIVE REPORT Performed at [facility]    Report Status PENDING  Incomplete  Culture, blood (routine x 2)     Status: None (Preliminary result)   Collection Time: 10/10/14  4:50 PM  Result Value Ref Range Status   Specimen Description BLOOD RIGHT HAND  Final   Special Requests BOTTLES DRAWN AEROBIC ONLY 4CC  Final   Culture   Final           BLOOD CULTURE RECEIVED NO GROWTH TO DATE CULTURE WILL BE HELD FOR 5 DAYS BEFORE ISSUING A FINAL NEGATIVE REPORT Performed at [facility]    Report Status PENDING  Incomplete  Urine culture     Status: None   Collection Time: 10/10/14  5:45 PM  Result Value Ref Range Status   Specimen Description URINE, CATHETERIZED  Final   Special Requests NONE  Final   Colony Count NO GROWTH Performed at [facility]   Final   Culture NO GROWTH Performed at [facility]   Final   Report Status 10/11/2014 FINAL  Final      Assessment: 81 YOF continues on Cefepime for HCAP.  Patient's renal function has been stable.  Cultures remain negative. Patient remains afebrile with a normal WBC. Patient's family wishes to continue pursuing care.  Vanc 12/27 >> 12/29 Cefepime 12/27 >> Cefdinir/Septra PTA  12/27 BCx x2 - NGTD 12/27 UCx - negative   Goal of Therapy:  Clearance of infection   Plan:  - Continue cefepime 2gm IV Q12H - Monitor renal fxn, clinical progress, and length of therapy   Russ Halo, PharmD Clinical Pharmacist - Resident Pager: [phone] 1/3/201612:05 PM

## 2014-10-17 NOTE — Progress Notes (Signed)
PULMONARY / CRITICAL CARE MEDICINE   Name: Sarah Kirk MRN: [id number] DOB: 04-13-1933    ADMISSION DATE:  10/10/2014  REFERRING MD :  [facility]  CHIEF COMPLAINT:  Short of breath  INITIAL PRESENTATION:  81 yo female from [place] with dyspnea, confusion, tachycardia from PNA, CHF, A fib with RVR.  She is followed by [name] for pulmonary fibrosis from [facility]  on 3 liters oxygen. .  She has hx of recurrent UTI's and had recent admission for Pseudomonal UTI. She is frequently on prednisone for her breathing and antibiotics for UTI's.  Her daughter says that she was in hospital recently for Pseudomonal UTI with sepsis, and sent to rehab.  She was noted to have decreased appetite and confusion at rehab.  Her oxygen level was in the 70's.  She was sent to [facility].  STUDIES:   SIGNIFICANT EVENTS: 12/27 Transfer from [place]   SUBJECTIVE:  Patient developed oliguria overnight, and has had poor po intake the last 24-48 hrs.  Foley has been flushed with no change in UOP.  The nurse feels she is more lethargic, but appears similar by my exam from the weekend.  No increased wob.  VITAL SIGNS: Temp:  [97.3 F (36.3 C)-98.1 F (36.7 C)] 97.8 F (36.6 C) (01/03 0735) Pulse Rate:  [89-114] 106 (01/03 1200) Resp:  [14-30] 18 (01/03 1200) BP: (124-147)/(62-97) 126/72 mmHg (01/03 1200) SpO2:  [94 %-100 %] 100 % (01/03 1200) INTAKE / OUTPUT:  Intake/Output Summary (Last 24 hours) at 10/17/14 1242 Last data filed at 10/17/14 0848  Gross per 24 hour  Intake    100 ml  Output    560 ml  Net   -460 ml    PHYSICAL EXAMINATION:  General:  Elderly female with no increased wob Neuro: mildly lethargic but answers questions, moves all 4 HEENT:  [place]/AT, no TMG/LN  Cardiovascular:  Irregular, CVR, no murmur Lungs:  Decreased depth of inspiration, basilar crackles. Abdomen:  Obese, soft, non tender, bs+ Musculoskeletal:  2+ edema  LABS:  CBC  Recent Labs Lab  10/11/14 0600 10/13/14 0350 10/15/14 0415  WBC 8.4 8.5 8.8  HGB 11.2* 11.6* 11.4*  HCT 33.1* 35.5* 35.2*  PLT 119* 134* 156   Coag's No results for input(s): APTT, INR in the last 168 hours. BMET  Recent Labs Lab 10/14/14 0400 10/15/14 0415 10/16/14 0429  NA 142 141 137  K 3.3* 3.1* 3.6  CL 92* 87* 85*  CO2 46* 47* 41*  BUN CREATININE 0.76 0.72 0.72  GLUCOSE 84 143* 115*      ASSESSMENT / PLAN:  PULMONARY A: Acute on chronic respiratory failure from HCAP, CHF with hx of pulmonary fibrosis. DNR/DNI. P:   Decrease iv steroids (nurse not sure she can swallow properly) Low dose Morphine prn for dyspnea  CARDIOVASCULAR Rt IJ CVL 12/26 [facility]) >> A:  A fib with RVR. Acute on chronic diastolic dysfx. P:  Oliguria noted, and therefore will d/c diuretic and increase IVF due to poor po intake.  Check bloodwork in am Lopressor 5 mg IV q6h -tapered off cardizem    INFECTIOUS A:   Sepsis. HCAP. Recurrent UTIs. P:    vancomycin >>12/29 Cefepime 12/27 >>  Blood 12/27 >>ng Urine 12/27 >>ng  ENDOCRINE A:   DM type II.   P:   SSI  NEUROLOGIC A:   Acute encephalopathy 2nd to sepsis, respiratory failure. (improving) P:   Morphine decreased 12/30 With elevated serum bicarb, also  suspect she may be hypercarbic.  She is DNI, so will monitor for now.   Pt's prognosis is very poor, and her daughter apparently has unrealistic expectations.  I think she would benefit from a family meeting first thing next week to discuss goals of care.

## 2014-10-18 ENCOUNTER — Inpatient Hospital Stay (HOSPITAL_COMMUNITY): Payer: Medicare Other

## 2014-10-18 DIAGNOSIS — I5030 Unspecified diastolic (congestive) heart failure: Secondary | ICD-10-CM

## 2014-10-18 DIAGNOSIS — J9600 Acute respiratory failure, unspecified whether with hypoxia or hypercapnia: Secondary | ICD-10-CM

## 2014-10-18 DIAGNOSIS — I509 Heart failure, unspecified: Secondary | ICD-10-CM | POA: Insufficient documentation

## 2014-10-18 LAB — BLOOD GAS, ARTERIAL
Acid-Base Excess: 16 mmol/L — ABNORMAL HIGH (ref 0.0–2.0)
BICARBONATE: 41.1 meq/L — AB (ref 20.0–24.0)
Drawn by: 24513
O2 Content: 2.5 L/min
O2 SAT: 98.3 %
PATIENT TEMPERATURE: 98.6
PCO2 ART: 60.2 mmHg — AB (ref 35.0–45.0)
PO2 ART: 108 mmHg — AB (ref 80.0–100.0)
TCO2: 43 mmol/L (ref 0–100)
pH, Arterial: 7.449 (ref 7.350–7.450)

## 2014-10-18 LAB — CBC
HEMATOCRIT: 35.7 % — AB (ref 36.0–46.0)
Hemoglobin: 11.3 g/dL — ABNORMAL LOW (ref 12.0–15.0)
MCH: 28 pg (ref 26.0–34.0)
MCHC: 31.7 g/dL (ref 30.0–36.0)
MCV: 88.4 fL (ref 78.0–100.0)
PLATELETS: 236 10*3/uL (ref 150–400)
RBC: 4.04 MIL/uL (ref 3.87–5.11)
RDW: 15.6 % — ABNORMAL HIGH (ref 11.5–15.5)
WBC: 10.8 10*3/uL — ABNORMAL HIGH (ref 4.0–10.5)

## 2014-10-18 LAB — BASIC METABOLIC PANEL
ANION GAP: 6 (ref 5–15)
BUN: 24 mg/dL — ABNORMAL HIGH (ref 6–23)
CHLORIDE: 93 meq/L — AB (ref 96–112)
CO2: 44 mmol/L (ref 19–32)
CREATININE: 0.75 mg/dL (ref 0.50–1.10)
Calcium: 8.5 mg/dL (ref 8.4–10.5)
GFR calc non Af Amer: 78 mL/min — ABNORMAL LOW (ref >90)
Glucose, Bld: 71 mg/dL (ref 70–99)
Potassium: 3.2 mmol/L — ABNORMAL LOW (ref 3.5–5.1)
SODIUM: 143 mmol/L (ref 135–145)

## 2014-10-18 LAB — GLUCOSE, CAPILLARY
GLUCOSE-CAPILLARY: 64 mg/dL — AB (ref 70–99)
GLUCOSE-CAPILLARY: 106 mg/dL — AB (ref 70–99)
GLUCOSE-CAPILLARY: 127 mg/dL — AB (ref 70–99)
Glucose-Capillary: 134 mg/dL — ABNORMAL HIGH (ref 70–99)
Glucose-Capillary: 130 mg/dL — ABNORMAL HIGH (ref 70–99)
Glucose-Capillary: 179 mg/dL — ABNORMAL HIGH (ref 70–99)
Glucose-Capillary: 207 mg/dL — ABNORMAL HIGH (ref 70–99)

## 2014-10-18 MED ORDER — POTASSIUM CHLORIDE 10 MEQ/50ML IV SOLN
10.0000 meq | INTRAVENOUS | Status: AC
  Administered 2014-10-18 (×4): 10 meq via INTRAVENOUS
  Filled 2014-10-18: qty 50

## 2014-10-18 MED ORDER — LEVALBUTEROL HCL 0.63 MG/3ML IN NEBU
0.6300 mg | INHALATION_SOLUTION | Freq: Four times a day (QID) | RESPIRATORY_TRACT | Status: DC
  Administered 2014-10-18 – 2014-10-23 (×15): 0.63 mg via RESPIRATORY_TRACT
  Filled 2014-10-18 (×36): qty 3

## 2014-10-18 MED ORDER — IPRATROPIUM BROMIDE 0.02 % IN SOLN
0.5000 mg | Freq: Four times a day (QID) | RESPIRATORY_TRACT | Status: DC
  Administered 2014-10-18 – 2014-10-23 (×15): 0.5 mg via RESPIRATORY_TRACT
  Filled 2014-10-18 (×16): qty 2.5

## 2014-10-18 MED ORDER — INSULIN ASPART 100 UNIT/ML ~~LOC~~ SOLN
0.0000 [IU] | Freq: Three times a day (TID) | SUBCUTANEOUS | Status: DC
  Administered 2014-10-18: 7 [IU] via SUBCUTANEOUS
  Administered 2014-10-19: 3 [IU] via SUBCUTANEOUS
  Administered 2014-10-19 (×2): 4 [IU] via SUBCUTANEOUS
  Administered 2014-10-20 (×2): 3 [IU] via SUBCUTANEOUS
  Administered 2014-10-20: 4 [IU] via SUBCUTANEOUS
  Administered 2014-10-21: 7 [IU] via SUBCUTANEOUS
  Administered 2014-10-21 – 2014-10-22 (×2): 4 [IU] via SUBCUTANEOUS
  Administered 2014-10-22: 15 [IU] via SUBCUTANEOUS
  Administered 2014-10-22: 4 [IU] via SUBCUTANEOUS
  Administered 2014-10-23: 3 [IU] via SUBCUTANEOUS
  Administered 2014-10-23: 4 [IU] via SUBCUTANEOUS

## 2014-10-18 MED ORDER — SODIUM CHLORIDE 0.9 % IV SOLN: INTRAVENOUS | Status: DC | PRN

## 2014-10-18 NOTE — Progress Notes (Signed)
Medicare Important Message given? YES  (If response is "NO", the following Medicare IM given date fields will be blank)  Date Medicare IM given: 10/18/14 Medicare IM given by:  Sheera Illingworth  

## 2014-10-18 NOTE — Progress Notes (Signed)
Results for MICKALA, LATON (MRN [id number]) as of 10/18/2014 09:48  Ref. Range 10/18/2014 00:25 10/18/2014 03:14 10/18/2014 03:39 10/18/2014 07:56  Glucose-Capillary Latest Range: 70-99 mg/dL 409 (H) 64 (L) 811 (H) 106 (H)  Had a low CBG at 03:14 of 64 mg/dl. Recommend decreasing Novolog correction scale to MODERATE every 4 hours if CBGs continue to be less than 100 mg/dl. Will continue to follow while in hospital. Smith Mince RN BSN CDE

## 2014-10-18 NOTE — Progress Notes (Signed)
NUTRITION FOLLOW UP  Intervention:    Continue Ensure Complete PO BID, each supplement provides 350 kcal and 13 grams of protein  Nutrition Dx:   Inadequate oral intake related to decreased alertness as evidenced by 50% meal completion, ongoing.  Goal:   Intake to meet >90% of estimated nutrition needs, progressing.  Monitor:   PO intake, labs, weight trend.  Assessment:   81 yo female transferred from [place] with dyspnea, confusion, tachycardia from PNA, CHF, A fib with RVR.   Diet has been advanced to dysphagia 1 with thin liquids. PO intake is good per discussion with RN. She consumed 50% of meals today. Ensure Complete has been ordered BID. Patient is drinking it well.  Height: Ht Readings from Last 1 Encounters:  10/10/14  (1.753 m)    Weight Status:   Wt Readings from Last 1 Encounters:  10/10/14 188 lb (85.276 kg)    Re-estimated needs:  Kcal: 1600-1800 Protein: 100-115 gm Fluid: 1.8 L  Skin: no issues  Diet Order: DIET - DYS 1 with thin liquids   Intake/Output Summary (Last 24 hours) at 10/18/14 1240 Last data filed at 10/18/14 1100  Gross per 24 hour  Intake 2055.83 ml  Output    800 ml  Net 1255.83 ml    Last BM: 1/2   Labs:   Recent Labs Lab 10/14/14 0400 10/15/14 0415 10/16/14 0429 10/18/14 0300  NA 142 141 137 143  K 3.3* 3.1* 3.6 3.2*  CL 92* 87* 85* 93*  CO2 46* 47* 41* 44*  BUN 24*  CREATININE 0.76 0.72 0.72 0.75  CALCIUM 9.0 8.6 8.9 8.5  MG 1.9  --   --   --   PHOS 1.7* 2.4  --   --   GLUCOSE 84 143* 115* 71    CBG (last 3)   Recent Labs  10/18/14 0339 10/18/14 0756 10/18/14 1123  GLUCAP 130* 106* 179*    Scheduled Meds: . antiseptic oral rinse  7 mL Mouth Rinse BID  . ceFEPime (MAXIPIME) IV  2 g Intravenous Q12H  . enoxaparin (LOVENOX) injection  40 mg Subcutaneous Q24H  . feeding supplement (ENSURE COMPLETE)  237 mL Oral BID BM  . insulin aspart  0-20 Units Subcutaneous TID WC  . ipratropium   0.5 mg Nebulization Q6H WA  . levalbuterol  0.63 mg Nebulization Q6H WA  . methylPREDNISolone (SOLU-MEDROL) injection  20 mg Intravenous Q24H  . metoprolol  5 mg Intravenous 4 times per day  . pantoprazole (PROTONIX) IV  40 mg Intravenous Q24H    Continuous Infusions:    Joaquin Courts, RD, LDN, CNSC Pager [phone] After Hours Pager [phone]

## 2014-10-18 NOTE — Progress Notes (Signed)
Patient to transfer to 2W32 report given to receiving nurse Gearldine Bienenstock all questions answered at this time.  Patient stable for transfer.

## 2014-10-18 NOTE — Progress Notes (Signed)
CRITICAL VALUE ALERT  Critical value received:  CO2 60  Date of notification:  04 jan16  Time of notification:  0335  Critical value read back:Yes.    Nurse who received alert:  Corrie Mckusick  MD notified (1st page):    Time of first page:  [phone]  MD notified (2nd page):  Time of second page:  Responding MD:  Dr. Orvan Seen  Time MD responded:  [phone]    No new orders given

## 2014-10-18 NOTE — Progress Notes (Signed)
PULMONARY / CRITICAL CARE MEDICINE   Name: Sarah Kirk MRN: [id number] DOB: 1933-10-03    ADMISSION DATE:  10/10/2014  REFERRING MD :  [facility]  CHIEF COMPLAINT:  Short of breath  INITIAL PRESENTATION:  81 yo female from [place] with dyspnea, confusion, tachycardia from PNA, CHF, A fib with RVR.  She is followed by [name] for pulmonary fibrosis from [facility]  on 3 liters oxygen. .  She has hx of recurrent UTI's and had recent admission for Pseudomonal UTI.  She is frequently on prednisone for her breathing and antibiotics for UTI's.  Her daughter says that she was in hospital recently for Pseudomonal UTI with sepsis, and sent to rehab.  She was noted to have decreased appetite and confusion at rehab.  Her oxygen level was in the 70's.  She was sent to [facility].  STUDIES:   SIGNIFICANT EVENTS: 12/27 Transfer from [place]  SUBJECTIVE:    VITAL SIGNS: Temp:  [97.4 F (36.3 C)-98.6 F (37 C)] 98.6 F (37 C) (01/04 1154) Pulse Rate:  [87-106] 96 (01/04 0752) Resp:  [16-20] 18 (01/04 0752) BP: (125-159)/(59-105) 157/88 mmHg (01/04 0752) SpO2:  [98 %-100 %] 100 % (01/04 0806) INTAKE / OUTPUT:  Intake/Output Summary (Last 24 hours) at 10/18/14 1159 Last data filed at 10/18/14 1100  Gross per 24 hour  Intake 2055.83 ml  Output    800 ml  Net 1255.83 ml    PHYSICAL EXAMINATION:  General:  Elderly female with no increased wob Neuro: mildly lethargic but answers questions, moves all 4 HEENT:  [place]/AT, no TMG/LN  Cardiovascular:  Irregular, CVR, no murmur Lungs:  Decreased depth of inspiration, basilar crackles. Abdomen:  Obese, soft, non tender, bs+ Musculoskeletal:  2+ edema  LABS:  CBC Recent Labs     10/18/14  0300  WBC  10.8*  HGB  11.3*  HCT  35.7*  PLT  236    BMET Recent Labs     10/16/14  0429  10/18/14  0300  NA  137  143  K  3.6  3.2*  CL  85*  93*  CO2  41*  44*  BUN  18  24*  CREATININE  0.72  0.75  GLUCOSE  115*   71    Electrolytes Recent Labs     10/16/14  0429  10/18/14  0300  CALCIUM  8.9  8.5    ABG Recent Labs     10/18/14  0323  PHART  7.449  PCO2ART  60.2*  PO2ART  108.0*    Glucose Recent Labs     10/17/14  1624  10/17/14  1928  10/18/14  0025  10/18/14  0314  10/18/14  0339  10/18/14  0756  GLUCAP  160*  167*  134*  64*  130*  106*    Imaging Dg Chest Port 1 View  10/18/2014   CLINICAL DATA:  Respiratory failure  EXAM: PORTABLE CHEST - 1 VIEW  COMPARISON:  10/13/2014  FINDINGS: Right central line tip remains in the lower right atrium, stable. Cardiomegaly, stable. Slight improvement in aeration and lung volumes since prior study with decreasing bibasilar opacities. Patchy bilateral opacities persist.  IMPRESSION: Improving lung volumes and decreasing bibasilar opacities.   Electronically Signed   By: Charlett Nose M.D.   On: 10/18/2014 08:02    ASSESSMENT / PLAN:  PULMONARY A: Acute on chronic respiratory failure from HCAP, CHF with hx of pulmonary fibrosis. DNR/DNI. P:   Oxygen to keep SpO2 88  to 94% F/u CXR intermittently Scheduled BD's with atrovent/xopenex Continue solumedrol 20 mg IV qdaily for now   CARDIOVASCULAR Rt IJ CVL 12/26 [facility]) >> A:  A fib with RVR. Acute on chronic diastolic dysfx. P:  Hold lasix for now Scheduled IV lopressor Monitor on telemetry for now  GASTROENTEROLOGY A: Dysphagia. GERD. P: D1 diet Protonix IV  INFECTIOUS A:   Sepsis. HCAP. Recurrent UTIs. P:   Day 10/14 cefepime  ENDOCRINE A:   DM type II.   P:   SSI  NEUROLOGIC A:   Acute encephalopathy 2nd to sepsis, respiratory failure >>improving. P:   Morphine decreased 12/30 Hold outpt wellbutrin, prozac  Goals of care >> DNR/DNI.  Family would still like to continue medical therapy otherwise.  Summary: Will transfer to telemetry.  Explained to daughter that her delirium is likely multifactorial.  Will continue current tx.  Explained that her  mother has very little reserve, and she will likely run the risk of recurrent infections/hospitalizations.  Coralyn Helling, MD [facility] Pulmonary/Critical Care 10/18/2014, 12:21 PM Pager:  [phone] After 3pm call: [phone]

## 2014-10-18 NOTE — Progress Notes (Signed)
eLink Physician-Brief Progress Note Patient Name: Sarah Kirk DOB: 12/25/1932 MRN: [id number]   Date of Service  10/18/2014  HPI/Events of Note  hypokalemia  eICU Interventions  Potassium replaced     Intervention Category Intermediate Interventions: Electrolyte abnormality - evaluation and management  DETERDING,ELIZABETH 10/18/2014, 4:17 AM

## 2014-10-19 LAB — CBC
HCT: 36.1 % (ref 36.0–46.0)
Hemoglobin: 11.5 g/dL — ABNORMAL LOW (ref 12.0–15.0)
MCH: 28.3 pg (ref 26.0–34.0)
MCHC: 31.9 g/dL (ref 30.0–36.0)
MCV: 88.9 fL (ref 78.0–100.0)
PLATELETS: 293 10*3/uL (ref 150–400)
RBC: 4.06 MIL/uL (ref 3.87–5.11)
RDW: 15.9 % — AB (ref 11.5–15.5)
WBC: 14.9 10*3/uL — ABNORMAL HIGH (ref 4.0–10.5)

## 2014-10-19 LAB — GLUCOSE, CAPILLARY
GLUCOSE-CAPILLARY: 129 mg/dL — AB (ref 70–99)
GLUCOSE-CAPILLARY: 173 mg/dL — AB (ref 70–99)
GLUCOSE-CAPILLARY: 197 mg/dL — AB (ref 70–99)
Glucose-Capillary: 167 mg/dL — ABNORMAL HIGH (ref 70–99)

## 2014-10-19 LAB — BASIC METABOLIC PANEL
Anion gap: 5 (ref 5–15)
BUN: 23 mg/dL (ref 6–23)
CO2: 39 mmol/L — ABNORMAL HIGH (ref 19–32)
Calcium: 8.7 mg/dL (ref 8.4–10.5)
Chloride: 95 mEq/L — ABNORMAL LOW (ref 96–112)
Creatinine, Ser: 0.79 mg/dL (ref 0.50–1.10)
GFR calc Af Amer: 89 mL/min — ABNORMAL LOW (ref >90)
GFR, EST NON AFRICAN AMERICAN: 77 mL/min — AB (ref >90)
Glucose, Bld: 152 mg/dL — ABNORMAL HIGH (ref 70–99)
POTASSIUM: 3.9 mmol/L (ref 3.5–5.1)
Sodium: 139 mmol/L (ref 135–145)

## 2014-10-19 MED ORDER — PREDNISONE 20 MG PO TABS
20.0000 mg | ORAL_TABLET | Freq: Every day | ORAL | Status: DC
  Administered 2014-10-20 – 2014-10-22 (×3): 20 mg via ORAL
  Filled 2014-10-19 (×4): qty 1

## 2014-10-19 MED ORDER — METOPROLOL TARTRATE 25 MG PO TABS
25.0000 mg | ORAL_TABLET | Freq: Two times a day (BID) | ORAL | Status: DC
  Administered 2014-10-19 – 2014-10-23 (×9): 25 mg via ORAL
  Filled 2014-10-19 (×11): qty 1

## 2014-10-19 MED ORDER — ALTEPLASE 100 MG IV SOLR
2.0000 mg | Freq: Once | INTRAVENOUS | Status: AC
  Administered 2014-10-19: 2 mg
  Filled 2014-10-19: qty 2

## 2014-10-19 MED ORDER — PANTOPRAZOLE SODIUM 40 MG PO TBEC
40.0000 mg | DELAYED_RELEASE_TABLET | Freq: Every day | ORAL | Status: DC
  Administered 2014-10-19 – 2014-10-23 (×5): 40 mg via ORAL
  Filled 2014-10-19 (×3): qty 1

## 2014-10-19 NOTE — Clinical Social Work Note (Signed)
Went to speak with patient, in her room, patient did not respond when CSW tried talking to her.  Attempted to call patient's daughter twice and each time the line was busy, to discuss plans for discharge.  Will try to contact patient's daughter tomorrow.  Ervin KnackEric R. Analleli Gierke, MSW, Theresia MajorsLCSWA [phone] 10/19/2014 4:59 PM

## 2014-10-19 NOTE — Evaluation (Signed)
Physical Therapy Evaluation Patient Details Name: Sarah Kirk MRN: [id number] DOB: [birth year] Today's Date: 10/19/2014   History of Present Illness  pt presents with Respiratory Failure and Hypoxia with HCAP, CHF, and Pulmonary fibrosis.    Clinical Impression  Patient with better participation during therapy today. Requires max assist for bed mobility and frequent cues for technique, facilitation, and sequencing. Able perform balance training at edge of bed, but tolerated exercises poorly. Unable to stand due to LE weakness. Patient will continue to benefit from skilled physical therapy services to further improve independence with functional mobility.     Follow Up Recommendations SNF    Equipment Recommendations  None recommended by PT    Recommendations for Other Services       Precautions / Restrictions Precautions Precautions: Fall Precaution Comments: Watch O2 sats.   Restrictions Weight Bearing Restrictions: No      Mobility  Bed Mobility Overal bed mobility: Needs Assistance Bed Mobility: Supine to Sit;Sit to Supine     Supine to sit: Max assist;HOB elevated Sit to supine: Max assist   General bed mobility comments: Cues to initiate bed mobility. Required max assist for LE support in/out of bed, and support for UEs to allow pt to pull trunk into sitting however required max assist for truncal support.   Transfers Overall transfer level: Needs assistance Equipment used: None;1 person hand held assist Transfers: Sit to/from Stand Sit to Stand: From elevated surface;Total assist         General transfer comment: Attempted sit<>stand x2 from elevated bed surface however pt unable to clear buttocks. [name] Second attempt more succsesful with Lt knee block however pt unable to stand due to LE weakness.  Ambulation/Gait                Stairs            Wheelchair Mobility     Modified Rankin (Stroke Patients Only)       Balance Overall balance assessment: Needs assistance Sitting-balance support: Feet supported;Bilateral upper extremity supported Sitting balance-Leahy Scale: Poor Sitting balance - Comments: [name], focusing on decreased need for assist. Intermittently needed min assist due to posterior lean.                                     Pertinent Vitals/Pain Pain Assessment: No/denies pain Pain Intervention(s): Monitored during session  BP 140/100, taken on Rt wrist HR 108 SpO2 96% on 3L supplemental O2    Home Living                        Prior Function                 Hand Dominance        Extremity/Trunk Assessment                         Communication      Cognition Arousal/Alertness: Awake/alert Behavior During Therapy: Flat affect Overall Cognitive Status: No family/caregiver present to determine baseline cognitive functioning Area of Impairment: Orientation;Attention;Memory;Following commands;Safety/judgement;Problem solving Orientation Level: Disoriented to;Place;Time;Situation Current Attention Level: Focused   Following Commands: Follows one step commands inconsistently;Follows one step commands with increased time Safety/Judgement: Decreased awareness of safety;Decreased  awareness of deficits   Problem Solving: Slow processing;Decreased initiation;Requires verbal cues;Difficulty sequencing;Requires tactile cues General Comments: Unclear pt's baseline level of cognition, but at this time she is only oriented to self.      General Comments      Exercises General Exercises - Lower Extremity Long Arc Quad: PROM;Both;5 reps;Seated Hip Flexion/Marching: AAROM;Both;10 reps;Seated      Assessment/Plan    PT Assessment    PT Diagnosis     PT Problem List    PT Treatment  Interventions     PT Goals (Current goals can be found in the Care Plan section) Acute Rehab PT Goals Patient Stated Goal: Unable to state.   PT Goal Formulation: Patient unable to participate in goal setting Time For Goal Achievement: 10/29/14 Potential to Achieve Goals: Fair    Frequency Min 2X/week   Barriers to discharge        Co-evaluation               End of Session Equipment Utilized During Treatment: Oxygen Activity Tolerance: Patient limited by fatigue Patient left: in bed;with call bell/phone within reach Nurse Communication: Mobility status         Time: [id number] PT Time Calculation (min) (ACUTE ONLY): 28 min   Charges:     PT Treatments $Therapeutic Activity: 23-37 mins   PT G CodesBerton Mount:        Rashon Rezek S 10/19/2014, 1:15 PM Sunday SpillersLogan Secor Roosevelt[place], [place]PT [phone]

## 2014-10-19 NOTE — Progress Notes (Signed)
Utilization review completed.  

## 2014-10-19 NOTE — Progress Notes (Signed)
PULMONARY / CRITICAL CARE MEDICINE   Name: Sarah Kirk MRN: [id number] DOB: 1933-04-21    ADMISSION DATE:  10/10/2014  REFERRING MD :  [facility]  CHIEF COMPLAINT:  Short of breath  INITIAL PRESENTATION:  81 y/o female from [place] with dyspnea, confusion, tachycardia from PNA, CHF, A fib with RVR.  She is followed by [name] for pulmonary fibrosis from [facility] on 3 liters oxygen.  She has hx of recurrent UTI's and had recent admission for Pseudomonal UTI.  She is frequently on prednisone for her breathing and antibiotics for UTI's.  Her daughter says that she was in hospital recently for Pseudomonal UTI with sepsis, and sent to rehab.  She was noted to have decreased appetite and confusion at rehab.  Her oxygen level was in the 70's.  She was sent to [facility].  STUDIES:   SIGNIFICANT EVENTS: 12/27  Transfer from [facility] 01/04  Tx to med floor   SUBJECTIVE: Pt denies acute complaints, hopeful to go home    VITAL SIGNS: Temp:  [98.1 F (36.7 C)-98.9 F (37.2 C)] 98.1 F (36.7 C) (01/05 0629) Pulse Rate:  [93-101] 93 (01/05 0629) Resp:  [17-18] 18 (01/05 0629) BP: (131-148)/(82-113) 148/113 mmHg (01/05 0629) SpO2:  [87 %-100 %] 100 % (01/05 0629)   INTAKE / OUTPUT:  Intake/Output Summary (Last 24 hours) at 10/19/14 0842 Last data filed at 10/19/14 8657  Gross per 24 hour  Intake     40 ml  Output    505 ml  Net   -465 ml    PHYSICAL EXAMINATION:  General:  Elderly female in NAD Neuro: awake, alert, moves all 4 HEENT:  [place]/AT, no TMG/LN  Cardiovascular:  Irregular, CVR, no murmur Lungs:  Decreased depth of inspiration, basilar crackles. Abdomen:  Obese, soft, non tender, bs+ Musculoskeletal:  1+ edema  LABS:  CBC Recent Labs     10/18/14  0300  WBC  10.8*  HGB  11.3*  HCT  35.7*  PLT  236    BMET Recent Labs     10/18/14  0300  10/19/14  0555  NA  143  139  K  3.2*  3.9  CL  93*  95*  CO2  44*  39*  BUN  24*  23   CREATININE  0.75  0.79  GLUCOSE  71  152*    Electrolytes Recent Labs     10/18/14  0300  10/19/14  0555  CALCIUM  8.5  8.7    ABG Recent Labs     10/18/14  0323  PHART  7.449  PCO2ART  60.2*  PO2ART  108.0*    Glucose Recent Labs     10/18/14  0339  10/18/14  0756  10/18/14  1123  10/18/14  1636  10/18/14  2128  10/19/14  0629  GLUCAP  130*  106*  179*  207*  127*  129*    Imaging Dg Chest Port 1 View  10/18/2014   CLINICAL DATA:  Respiratory failure  EXAM: PORTABLE CHEST - 1 VIEW  COMPARISON:  10/13/2014  FINDINGS: Right central line tip remains in the lower right atrium, stable. Cardiomegaly, stable. Slight improvement in aeration and lung volumes since prior study with decreasing bibasilar opacities. Patchy bilateral opacities persist.  IMPRESSION: Improving lung volumes and decreasing bibasilar opacities.   Electronically Signed   By: Charlett Nose M.D.   On: 10/18/2014 08:02    ASSESSMENT / PLAN:  PULMONARY A: Acute on chronic respiratory failure  from HCAP, CHF with hx of pulmonary fibrosis. DNR/DNI. P:   Oxygen to keep SpO2 88 to 94% F/u CXR intermittently Scheduled BD's with atrovent/xopenex Q6 Transition to oral pred 20 mg QD with taper (not prednisone dependent at baseline)  CARDIOVASCULAR Rt IJ CVL 12/26 Duke Salvia([place]) >> A:  A fib with RVR - rate controlled  Acute on chronic diastolic dysfx. P:  Hold lasix for now Change lopressor to 25 mg BID PO PRN lopressor to maintain HR < 120 Telemetry monitoring  Will need to consider d/c central line if tolerates PO's meds  GASTROENTEROLOGY A: Dysphagia. GERD. P: D1 diet Protonix to PO  INFECTIOUS A:   Sepsis. HCAP. Recurrent UTIs. P:   Day 11/14 cefepime  ENDOCRINE A:   DM type II.   P:   SSI  NEUROLOGIC A:   Acute encephalopathy 2nd to sepsis, respiratory failure >>improving. P:   Morphine decreased 12/30 Hold outpt wellbutrin, prozac, consider restart   Goals of care >>  DNR/DNI.  Family would still like to continue medical therapy otherwise.  Will need to determine if family wants to take patient home vs SNF.      Canary BrimBrandi Ollis, NP-C [facility] Pgr: [phone] or [phone]  Reviewed above, examined.  Mental status much improved.  She is feeling depressed.  Lungs sounds better.  Tolerating diet.  Resume outpt prozac, wellbutrin.  Continue to work with PT.  Will need to determine best option for disposition >> might be ready for hospital d/c in next 48 to 72 hours.  Coralyn HellingVineet Waldemar Siegel, MD [facility] 10/19/2014, 12:38 PM Pager:  [phone] After 3pm call: [phone]

## 2014-10-20 DIAGNOSIS — G9340 Encephalopathy, unspecified: Secondary | ICD-10-CM | POA: Insufficient documentation

## 2014-10-20 LAB — BASIC METABOLIC PANEL
Anion gap: 6 (ref 5–15)
BUN: 23 mg/dL (ref 6–23)
CHLORIDE: 95 meq/L — AB (ref 96–112)
CO2: 38 mmol/L — AB (ref 19–32)
Calcium: 8.7 mg/dL (ref 8.4–10.5)
Creatinine, Ser: 0.63 mg/dL (ref 0.50–1.10)
GFR calc Af Amer: >90 mL/min (ref >90)
GFR, EST NON AFRICAN AMERICAN: 83 mL/min — AB (ref >90)
Glucose, Bld: 151 mg/dL — ABNORMAL HIGH (ref 70–99)
Potassium: 4 mmol/L (ref 3.5–5.1)
Sodium: 139 mmol/L (ref 135–145)

## 2014-10-20 LAB — GLUCOSE, CAPILLARY
GLUCOSE-CAPILLARY: 143 mg/dL — AB (ref 70–99)
GLUCOSE-CAPILLARY: 122 mg/dL — AB (ref 70–99)
GLUCOSE-CAPILLARY: 174 mg/dL — AB (ref 70–99)
Glucose-Capillary: 201 mg/dL — ABNORMAL HIGH (ref 70–99)

## 2014-10-20 LAB — CBC
HEMATOCRIT: 35.4 % — AB (ref 36.0–46.0)
Hemoglobin: 11.4 g/dL — ABNORMAL LOW (ref 12.0–15.0)
MCH: 28.5 pg (ref 26.0–34.0)
MCHC: 32.2 g/dL (ref 30.0–36.0)
MCV: 88.5 fL (ref 78.0–100.0)
Platelets: 281 10*3/uL (ref 150–400)
RBC: 4 MIL/uL (ref 3.87–5.11)
RDW: 16.1 % — ABNORMAL HIGH (ref 11.5–15.5)
WBC: 11.6 10*3/uL — ABNORMAL HIGH (ref 4.0–10.5)

## 2014-10-20 MED ORDER — SODIUM CHLORIDE 0.9 % IJ SOLN
10.0000 mL | INTRAMUSCULAR | Status: DC | PRN
  Administered 2014-10-20: 20 mL
  Administered 2014-10-20 (×2): 10 mL
  Administered 2014-10-21: 20 mL
  Administered 2014-10-21: 10 mL
  Administered 2014-10-22: 20 mL
  Filled 2014-10-20 (×5): qty 40

## 2014-10-20 NOTE — Progress Notes (Signed)
Urine leaking out around foley cath. No kinks or obstructions noted. Urine noted flowing through catheter tubing. 5 ml added to catheter balloon.

## 2014-10-20 NOTE — Progress Notes (Signed)
Patient FH:QRFXJ Breidenbach      DOB: Jul 15, 1933      OIT:[id number]   Palliative Medicine Team at [facility] Progress Note    Subjective: Drowsy, no acute complaints.  Eating some pudding today and drinking liquids. Able to take pills orally.       Filed Vitals:   10/20/14 0548  BP: 137/89  Pulse: 101  Temp: 97.9 F (36.6 C)  Resp: 18   Physical exam: GEN: drowsy but arouses easily and can answer few questions CV: tachy LUNGS: CTAB ABD: obese, ND  CBC    Component Value Date/Time   WBC 11.6* 10/20/2014 0634   RBC 4.00 10/20/2014 0634   HGB 11.4* 10/20/2014 0634   HCT 35.4* 10/20/2014 0634   PLT 281 10/20/2014 0634   MCV 88.5 10/20/2014 0634   MCH 28.5 10/20/2014 0634   MCHC 32.2 10/20/2014 0634   RDW 16.1* 10/20/2014 0634   LYMPHSABS 1.6 09/02/2014 1554   MONOABS 0.9 09/02/2014 1554   EOSABS 0.2 09/02/2014 1554   BASOSABS 0.1 09/02/2014 1554    CMP     Component Value Date/Time   NA 139 10/20/2014 0634   K 4.0 10/20/2014 0634   CL 95* 10/20/2014 0634   CO2 38* 10/20/2014 0634   GLUCOSE 151* 10/20/2014 0634   BUN 23 10/20/2014 0634   CREATININE 0.63 10/20/2014 0634   CALCIUM 8.7 10/20/2014 0634   PROT 6.2 12/15/2013 1546   ALBUMIN 3.1* 12/15/2013 1546   AST 23 12/15/2013 1546   ALT 16 12/15/2013 1546   ALKPHOS 149* 12/15/2013 1546   BILITOT 1.3* 12/15/2013 1546   GFRNONAA 83* 10/20/2014 0634   GFRAA >90 10/20/2014 0634      Assessment and plan: 81 yo female with PMHx of pulm fibrosis, afib, diastolic HF who presented from [facility] on 10/10/14 with confusion, dyspnea, tachycardia  1. Code Status: DNR   2. Goals of Care: Met today with daughter Katharine Look. She is very happy that her mother has made progress here. Her goal is to now get Taimane to rehab and then back home. She acknowledges that Aishani would never live in a nursing home situation and her ability to live at home with her cats is important.  When I ask Katharine Look what she thinks it  would take to get her back home, she thinks she needs to be able to stand and use a commode.  She also discusses plans to get her to an adult daycare and have her use motorized wheelchairs to get around.  I am not sure how realistic Sandra's expectations are.  We talked about importance of Elcie not having further setbacks in order to meet these goals, while acknowledging that she will be at risk for ongoing issues despite best possible care.  She is interested in SNF/rehab in [place] area which SW is helping her with. I am not sure if there are outpatient palliative care services available in that area, but I think she would benefit from this ongoing support if we can arrange this.   3. Symptom Management:  1. Dyspnea: Resolved. On steroids, inhalers, etc.  Acute encephalopathy- improving. My recommendation would be continue to avoid benzo's. If sleep/wake cycle an issue would consider remeron or trazodone qhs.    4. Psychosocial/Spiritual: Lucrezia has 1 daughter named Katharine Look. Had a son that passed away 3 years ago and husband deceased >20 years ago. Katharine Look has her own health issues as well. They are a spiritual family and Katharine Look requests Chaplain  support/prayer for her mother. I think Katharine Look would benefit from chaplain counseling as well.   Total Time: 40 minutes >50% of time spent in counseling and coordination of care regarding above.  Doran Clay D.O. Palliative Medicine Team at [facility]  Pager: [phone] Team Phone: [phone]

## 2014-10-20 NOTE — Progress Notes (Signed)
PULMONARY / CRITICAL CARE MEDICINE   Name: Sarah Kirk MRN: [id number] DOB: 1932/12/09    ADMISSION DATE:  10/10/2014  REFERRING MD :  [facility]  CHIEF COMPLAINT:  Short of breath  INITIAL PRESENTATION:  81 y/o female from [place] with dyspnea, confusion, tachycardia from PNA, CHF, A fib with RVR.  She is followed by [name] for pulmonary fibrosis from [facility] on 3 liters oxygen.  She has hx of recurrent UTI's and had recent admission for Pseudomonal UTI.  She is frequently on prednisone for her breathing and antibiotics for UTI's.  Her daughter says that she was in hospital recently for Pseudomonal UTI with sepsis, and sent to rehab.  She was noted to have decreased appetite and confusion at rehab.  Her oxygen level was in the 70's.  She was sent to [facility].  STUDIES:   SIGNIFICANT EVENTS: 12/27  Transfer from [facility] 01/04  Tx to med floor   SUBJECTIVE: Very sleepy   VITAL SIGNS: Temp:  [97.7 F (36.5 C)-98.2 F (36.8 C)] 97.9 F (36.6 C) (01/06 0548) Pulse Rate:  [95-101] 101 (01/06 0548) Resp:  [17-18] 18 (01/06 0548) BP: (137-155)/(89-103) 137/89 mmHg (01/06 0548) SpO2:  [97 %-98 %] 98 % (01/06 0548)   INTAKE / OUTPUT:  Intake/Output Summary (Last 24 hours) at 10/20/14 0932 Last data filed at 10/19/14 2103  Gross per 24 hour  Intake    440 ml  Output    300 ml  Net    140 ml    PHYSICAL EXAMINATION:  General:  Elderly female in NAD Neuro: Sleepy, but moves all 4 to commands HEENT:  [place]/AT, no TMG/LN  Cardiovascular:  Irregular, CVR, no murmur Lungs:  Decreased depth of inspiration, basilar crackles. Abdomen:  Obese, soft, non tender, bs+ Musculoskeletal:  1+ edema  LABS:  CBC Recent Labs     10/18/14  0300  10/19/14  0855  10/20/14  0634  WBC  10.8*  14.9*  11.6*  HGB  11.3*  11.5*  11.4*  HCT  35.7*  36.1  35.4*  PLT  236  293  281    BMET Recent Labs     10/18/14  0300  10/19/14  0555  10/20/14  0634  NA   143  139  139  K  3.2*  3.9  4.0  CL  93*  95*  95*  CO2  44*  39*  38*  BUN  24*  23  23  CREATININE  0.75  0.79  0.63  GLUCOSE  71  152*  151*    Electrolytes Recent Labs     10/18/14  0300  10/19/14  0555  10/20/14  0634  CALCIUM  8.5  8.7  8.7    ABG Recent Labs     10/18/14  0323  PHART  7.449  PCO2ART  60.2*  PO2ART  108.0*    Glucose Recent Labs     10/18/14  2128  10/19/14  0629  10/19/14  1129  10/19/14  1652  10/19/14  2046  10/20/14  0623  GLUCAP  127*  129*  173*  167*  197*  143*    Imaging No results found.  ASSESSMENT / PLAN:  PULMONARY A: Acute on chronic respiratory failure from HCAP, CHF with hx of pulmonary fibrosis. DNR/DNI. P:   Oxygen to keep SpO2 88 to 94% F/u CXR intermittently Scheduled BD's with atrovent/xopenex Q6 Transition to oral pred 20 mg QD with taper (not prednisone dependent  at baseline)  CARDIOVASCULAR Rt IJ CVL 12/26 [name] >> A:  A fib with RVR - rate controlled  Acute on chronic diastolic dysfx. P:  Hold lasix for now Change lopressor to 25 mg BID PO PRN lopressor to maintain HR < 120 Telemetry monitoring  Will need to consider d/c central line if tolerates PO's meds. Leave in for now till completes abx or is dc'd.  GASTROENTEROLOGY A: Dysphagia. GERD. P: D1 diet Protonix to PO  INFECTIOUS A:   Sepsis. HCAP. Recurrent UTIs. P:   Day 12/14 cefepime  ENDOCRINE A:   DM type II.   P:   SSI  NEUROLOGIC A:   Acute encephalopathy 2nd to sepsis, respiratory failure >>improving. Still sleepy P:   Morphine decreased 12/30 Hold outpt wellbutrin, prozac, consider restart only if more awake.  Goals of care >> DNR/DNI.  Family would still like to continue medical therapy otherwise.  Will need to determine if family wants to take patient home vs SNF.  Most likely will need SNF.  Brett CanalesSteve Minor ACNP [facility] Pager [phone] till 3 pm If no answer page [phone] 10/20/2014, 9:33 AM    Reviewed above, examined.  Mental status continues to improve.  Lungs clearer.  Will need SNF placement >> likely will be ready for transfer by 1/09 assuming she has a bed offer.  Updated pt's daughter at bedside.  Sarah HellingVineet Maizie Garno, MD [facility] 10/20/2014, 4:23 PM Pager:  [phone] After 3pm call: [phone]

## 2014-10-20 NOTE — Progress Notes (Signed)
ANTIBIOTIC CONSULT NOTE - FOLLOW UP  Pharmacy Consult for cefepime  Indication: pneumonia  Allergies  Allergen Reactions  . Tetracyclines & Related Other (See Comments)    Breaks her mouth out  . Atenolol     Worsening lung function  . Macrobid [Nitrofurantoin]     Worsening lung fx  . Metformin     Doctor doesn't want pt to take because it will scar her lungs    Patient Measurements: Height: 5\' 9"  (175.3 cm) Weight: 188 lb (85.276 kg) IBW/kg (Calculated) : 66.2  Vital Signs: Temp: 98 F (36.7 C) (01/06 1356) Temp Source: Axillary (01/06 1356) BP: 145/100 mmHg (01/06 1356) Pulse Rate: 90 (01/06 1356) Intake/Output from previous day: 01/05 0701 - 01/06 0700 In: 560 [P.O.:360; IV Piggyback:200] Out: 300 [Urine:300] Intake/Output from this shift: Total I/O In: 120 [P.O.:120] Out: 100 [Urine:100]  Labs:  Recent Labs  10/18/14 0300 10/19/14 0555 10/19/14 0855 10/20/14 0634  WBC 10.8*  --  14.9* 11.6*  HGB 11.3*  --  11.5* 11.4*  PLT 236  --  293 281  CREATININE 0.75 0.79  --  0.63   Estimated Creatinine Clearance: 66.4 mL/min (by C-G formula based on Cr of 0.63). No results for input(s): VANCOTROUGH, VANCOPEAK, VANCORANDOM, GENTTROUGH, GENTPEAK, GENTRANDOM, TOBRATROUGH, TOBRAPEAK, TOBRARND, AMIKACINPEAK, AMIKACINTROU, AMIKACIN in the last 72 hours.   Assessment: Sarah Kirk continues on cefepime D#11/14 for HCAP, hx recurrent UTI/PsA UTI. DNR/DNI, but family wishes to continue treatment. Afebrile, WBC 14.9 > 11.6. Renal function has been stable. Est. crcl ~ 60-70 ml/min.   Vanc 12/27 >> 12/29 Cefepime 12/27 >> Cefdinir/Septra PTA  12/27 BCx x2 - NF 12/27 UCx - NF  Plan:  - Continue cefepime 2gm IV Q12H, plan is 14 days, stop date entered. - Pharmacy sign off.  Thanks.   Bayard HuggerMei Halli Equihua, PharmD, BCPS  Clinical Pharmacist  Pager: [phone]   10/20/2014,2:10 PM

## 2014-10-20 NOTE — Clinical Social Work Note (Signed)
Attempted to contact patient's daughter again and left a message on her recorder to call back Child psychotherapistsocial worker.  Ervin KnackEric R. Dezyrae Kensinger, MSW, Theresia MajorsLCSWA [phone] 10/20/2014 1:33 PM

## 2014-10-21 DIAGNOSIS — I509 Heart failure, unspecified: Secondary | ICD-10-CM

## 2014-10-21 LAB — GLUCOSE, CAPILLARY
GLUCOSE-CAPILLARY: 228 mg/dL — AB (ref 70–99)
Glucose-Capillary: 114 mg/dL — ABNORMAL HIGH (ref 70–99)
Glucose-Capillary: 178 mg/dL — ABNORMAL HIGH (ref 70–99)
Glucose-Capillary: 239 mg/dL — ABNORMAL HIGH (ref 70–99)

## 2014-10-21 LAB — CLOSTRIDIUM DIFFICILE BY PCR: Toxigenic C. Difficile by PCR: NEGATIVE

## 2014-10-21 MED ORDER — FUROSEMIDE 40 MG PO TABS
40.0000 mg | ORAL_TABLET | Freq: Every day | ORAL | Status: DC
Start: 2014-10-21 — End: 2014-10-23
  Administered 2014-10-21 – 2014-10-23 (×3): 40 mg via ORAL
  Filled 2014-10-21 (×3): qty 1

## 2014-10-21 NOTE — Progress Notes (Signed)
Physical Therapy Treatment Patient Details Name: Sarah BirchwoodHelen Loveday MRN: [id number] DOB: 20-Apr-1933 Today's Date: 10/21/2014    History of Present Illness pt presents with Respiratory Failure and Hypoxia with HCAP, CHF, and Pulmonary fibrosis.      PT Comments    Patient slowly progressing towards physical therapy goals today. Improved sitting balance although posture remains poor and pt fatigues very quickly. Nasal cannula beside pt when PT entered room with SpO2 at 91% room air. Quickly dropped into 60s with HR to 150 during transfer, required 5L supplemental O2 with pursed lip breathing to return to 90% saturation. Requires assistance to actively participate with therapeutic exercises. Patient will continue to benefit from skilled physical therapy services to further improve independence with functional mobility.   Follow Up Recommendations  SNF     Equipment Recommendations  None recommended by PT    Recommendations for Other Services OT consult     Precautions / Restrictions Precautions Precautions: Fall Precaution Comments: Watch O2 sats.   Restrictions Weight Bearing Restrictions: No    Mobility  Bed Mobility Overal bed mobility: Needs Assistance Bed Mobility: Supine to Sit     Supine to sit: HOB elevated;Min assist     General bed mobility comments: Good progress with bed mobility today. Required min assist with tactile cues for sequencing to edge of bed, min assist to scoot bed pad forward with some support through LUE allow pt to pull herself up as able. Heavy use of rail.  Transfers Overall transfer level: Needs assistance Equipment used: Ambulation equipment used (sara plus) Transfers: Sit to/from Stand Sit to Stand: Total assist         General transfer comment: Continues to demonstrate significant LE weakness. Practiced weight bearing through LEs with assist from [facility]. Unable to tolerate fully upright stance but was demonstrating good effort to stand,  but fatigued rapidly with HR up to 150 and SpO2 rapidly dropping into 60s. Required 5L supplemental O2 to rise to 90% with cues for pursed lip breathing.  Ambulation/Gait                 Stairs            Wheelchair Mobility    Modified Rankin (Stroke Patients Only)       Balance       Sitting balance - Comments: Practiced sitting balance x5 minutes edege of bed and again once sitting in chair. Did not require physical assist to maintain seated posture today. However, demonstrates great difficulty with keeping head up and shoulders back as she fatigues rapidly.                            Cognition Arousal/Alertness: Awake/alert Behavior During Therapy: Flat affect Overall Cognitive Status: No family/caregiver present to determine baseline cognitive functioning                      Exercises General Exercises - Lower Extremity Ankle Circles/Pumps: AAROM;Both;20 reps;Supine Long Arc Quad: Both;Seated;AAROM;Strengthening;10 reps Straight Leg Raises: AAROM;Strengthening;Both;10 reps;Supine    General Comments General comments (skin integrity, edema, etc.): SpO2 dropped into 60s during transfer to chair. 5L supplemental O2 applied and returned to 90% with cues for pursed lip breathing and several minutes of rest.      Pertinent Vitals/Pain Pain Assessment: Faces Faces Pain Scale: Hurts even more Pain Location: "bowels" Pain Descriptors / Indicators: Grimacing Pain Intervention(s): Monitored during session;Repositioned;Patient requesting pain meds-RN notified  SpO2 91% on room air - [place] not on pt when PT entered room. HR to 150 during transfer SpO2 dropped to 60s, then 90% with supplemental O2 to 5L    Home Living                      Prior Function            PT Goals (current goals can now be found in the care plan section) Acute Rehab PT Goals PT Goal Formulation: Patient unable to participate in goal setting Time For Goal  Achievement: 10/29/14 Potential to Achieve Goals: Fair Progress towards PT goals: Progressing toward goals    Frequency  Min 2X/week    PT Plan Current plan remains appropriate    Co-evaluation             End of Session Equipment Utilized During Treatment: Oxygen Activity Tolerance: Patient limited by fatigue Patient left: with call bell/phone within reach;in chair;with nursing/sitter in room     Time: 1024-1058 PT Time Calculation (min) (ACUTE ONLY): 34 min  Charges:  $Therapeutic Exercise: 8-22 mins $Therapeutic Activity: 8-22 mins                    G Codes:      Berton Mount 11/19/2014, 11:32 AM Charlsie Merles, PT [phone]

## 2014-10-21 NOTE — Progress Notes (Signed)
Medicare Important Message given? YES  (If response is "NO", the following Medicare IM given date fields will be blank)  Date Medicare IM given: 10/21/14 Medicare IM given by:  Zenda AlpersWebster [employer] FinancialKristi

## 2014-10-21 NOTE — Progress Notes (Signed)
PULMONARY / CRITICAL CARE MEDICINE   Name: Sarah Kirk MRN: [id number] DOB: [birth year]    ADMISSION DATE:  10/10/2014  REFERRING MD :  [facility]  CHIEF COMPLAINT:  Short of breath  INITIAL PRESENTATION:  81 y/o female from [place] with dyspnea, confusion, tachycardia from PNA, CHF, A fib with RVR.  She is followed by [name] for pulmonary fibrosis from [facility] on 3 liters oxygen.  She has hx of recurrent UTI's and had recent admission for Pseudomonal UTI.  She is frequently on prednisone for her breathing and antibiotics for UTI's.  Her daughter says that she was in hospital recently for Pseudomonal UTI with sepsis, and sent to rehab.  She was noted to have decreased appetite and confusion at rehab.  Her oxygen level was in the 70's.  She was sent to [facility].  STUDIES:   SIGNIFICANT EVENTS: 12/27  Transfer from [facility] 01/04  Tx to med floor   SUBJECTIVE: Very sleepy   VITAL SIGNS: Temp:  [97.6 F (36.4 C)-98.8 F (37.1 C)] 97.6 F (36.4 C) (01/07 0535) Pulse Rate:  [90-91] 91 (01/07 0535) Resp:  [17-18] 18 (01/07 0535) BP: (136-154)/(86-100) 154/99 mmHg (01/07 0535) SpO2:  [93 %-99 %] 99 % (01/07 0929)   INTAKE / OUTPUT:  Intake/Output Summary (Last 24 hours) at 10/21/14 0942 Last data filed at 10/21/14 0800  Gross per 24 hour  Intake    480 ml  Output    225 ml  Net    255 ml    PHYSICAL EXAMINATION:  General:  Elderly female in NAD Neuro: Sleepy, but arousable, follows commands HEENT:  [place]/AT, no TMG/LN  Cardiovascular:  Irregular, CVR, no murmur Lungs:  resps even, non labored on [place], bibasilar crackles, few exp wheeze  Abdomen:  Obese, soft, non tender, bs+ Musculoskeletal:  1+ edema  LABS:  CBC Recent Labs     10/19/14  0855  10/20/14  0634  WBC  14.9*  11.6*  HGB  11.5*  11.4*  HCT  36.1  35.4*  PLT  293  281    BMET Recent Labs     10/19/14  0555  10/20/14  0634  NA  139  139  K  3.9  4.0  CL  95*  95*  CO2   39*  38*  BUN  23  23  CREATININE  0.79  0.63  GLUCOSE  152*  151*    Electrolytes Recent Labs     10/19/14  0555  10/20/14  0634  CALCIUM  8.7  8.7    ABG No results for input(s): PHART, PCO2ART, PO2ART in the last 72 hours.  Glucose Recent Labs     10/19/14  2046  10/20/14  0623  10/20/14  1116  10/20/14  1656  10/20/14  2111  10/21/14  0610  GLUCAP  197*  143*  122*  174*  201*  114*    Imaging No results found.  ASSESSMENT / PLAN:  PULMONARY A: Acute on chronic respiratory failure from HCAP, CHF with hx of pulmonary fibrosis. DNR/DNI. P:   Oxygen to keep SpO2 88 to 94% F/u CXR intermittently Scheduled BD's with atrovent/xopenex Q6 Transition to oral pred 20 mg QD with taper (not prednisone dependent at baseline)  CARDIOVASCULAR Rt IJ CVL 12/26 [facility] Salvia) >> A:  HTN A fib with RVR - rate controlled  Acute on chronic diastolic dysfx. P:  Resume home dose lasix  Continue lopressor 25 mg BID PO PRN lopressor to maintain  HR < 120 Telemetry monitoring  Will need to consider d/c central line if tolerates PO's meds. Leave in for now till completes abx or is dc'd.  GASTROENTEROLOGY A: Dysphagia. GERD. P: D1 diet Protonix to PO  INFECTIOUS A:   Sepsis. HCAP. Recurrent UTIs. P:   Day 13/14 cefepime - stop date added   ENDOCRINE A:   DM type II.   P:   SSI  NEUROLOGIC A:   Acute encephalopathy 2nd to sepsis, respiratory failure >>improving. Still sleepy P:   Morphine decreased 12/30 - 0.5-1mg  q3 PRN Hold outpt wellbutrin, prozac, consider restart only once more awake.  Goals of care >> DNR/DNI.  Family would still like to continue medical therapy otherwise.  Palliative care assisting with symptom management and goals of care. Goal is for short term rehab and ultimately home.  Tentatively consider 1/9 for SNF if she has bed placement arranged.   Will ask [facility] to assume care 1/8.     Dirk DressKaty Whiteheart, NP 10/21/2014  9:42 AM Pager:  [phone] or [phone]  Attending:  I have seen and examined the patient with nurse practitioner/resident and agree with the note above.   Seems frail, but says she feels OK Rhonchi bilat RRR  Appreciate palliative  [facility]  Heber CarolinaBrent McQuaid, MD [place] [facility] Pager: [phone] Cell: [phone] If no response, call [phone]

## 2014-10-21 NOTE — Clinical Social Work Psychosocial (Signed)
Clinical Social Work Department BRIEF PSYCHOSOCIAL ASSESSMENT 10/21/2014  Patient:  Sarah Kirk,Sarah Kirk     Account Number:  [id number]     Admit date:  10/10/2014  Clinical Social Worker:  Elouise MunroeANTERHAUS,Enis Leatherwood, LCSWA  Date/Time:  10/21/2014 05:25 PM  Referred by:  Physician  Date Referred:  10/21/2014 Referred for  SNF Placement   Other Referral:   Interview type:  Family Other interview type:    PSYCHOSOCIAL DATA Living Status:  FAMILY Admitted from facility:  [facility], [place] Level of care:  Skilled Nursing Facility Primary support name:  Sarah Kirk Primary support relationship to patient:  FAMILY Degree of support available:   Patient lives with her daughter.    CURRENT CONCERNS Current Concerns  Post-Acute Placement   Other Concerns:    SOCIAL WORK ASSESSMENT / PLAN Patient is a 81 year old female who is hard of hearing and sometimes confused.  Patient lives with her daughter and plans to return back home once she has received some therapy at a SNF.  Patient was at [facility] in [place], but patient does not want to return back to the facility. Patient's daughter Sarah Kirk is the main contact.  Patient is agreeable to going to a SNF for short term rehab and then returning back home.  Patient will be discharge to SNF once she is medically ready and discharge orders have been received.   Assessment/plan status:  Psychosocial Support/Ongoing Assessment of Needs Other assessment/ plan:   Information/referral to community resources:    PATIENT'S/FAMILY'S RESPONSE TO PLAN OF CARE: Patient and family agreeable to short term rehab.    Sarah Kirk, MSW, Theresia MajorsLCSWA [phone] 10/21/2014 5:31 PM

## 2014-10-21 NOTE — Clinical Social Work Note (Addendum)
Met with patient and her daughter at bedside to discuss placements.  Patient's daughter did not want patient to go back to [facility].  Discussed other bed offers, family and patient decided to go to [facility] once she is medically ready and discharge orders have been received.  Patient was informed that a private room is available, and if patient is discharged on Saturday they can admit her.   Jones Broom. [place], MSW, [place] 10/21/2014 5:19 PM

## 2014-10-22 ENCOUNTER — Inpatient Hospital Stay (HOSPITAL_COMMUNITY): Payer: Medicare Other

## 2014-10-22 LAB — GLUCOSE, CAPILLARY
GLUCOSE-CAPILLARY: 155 mg/dL — AB (ref 70–99)
GLUCOSE-CAPILLARY: 162 mg/dL — AB (ref 70–99)
Glucose-Capillary: 182 mg/dL — ABNORMAL HIGH (ref 70–99)
Glucose-Capillary: 349 mg/dL — ABNORMAL HIGH (ref 70–99)

## 2014-10-22 LAB — CBC
HCT: 34.5 % — ABNORMAL LOW (ref 36.0–46.0)
Hemoglobin: 11.3 g/dL — ABNORMAL LOW (ref 12.0–15.0)
MCH: 28.6 pg (ref 26.0–34.0)
MCHC: 32.8 g/dL (ref 30.0–36.0)
MCV: 87.3 fL (ref 78.0–100.0)
PLATELETS: 251 10*3/uL (ref 150–400)
RBC: 3.95 MIL/uL (ref 3.87–5.11)
RDW: 16.5 % — ABNORMAL HIGH (ref 11.5–15.5)
WBC: 14.4 10*3/uL — ABNORMAL HIGH (ref 4.0–10.5)

## 2014-10-22 LAB — BASIC METABOLIC PANEL
ANION GAP: 10 (ref 5–15)
BUN: 18 mg/dL (ref 6–23)
CALCIUM: 8.4 mg/dL (ref 8.4–10.5)
CO2: 35 mmol/L — ABNORMAL HIGH (ref 19–32)
Chloride: 92 mEq/L — ABNORMAL LOW (ref 96–112)
Creatinine, Ser: 0.61 mg/dL (ref 0.50–1.10)
GFR calc Af Amer: >90 mL/min (ref >90)
GFR, EST NON AFRICAN AMERICAN: 84 mL/min — AB (ref >90)
GLUCOSE: 172 mg/dL — AB (ref 70–99)
Potassium: 3.3 mmol/L — ABNORMAL LOW (ref 3.5–5.1)
SODIUM: 137 mmol/L (ref 135–145)

## 2014-10-22 MED ORDER — PREDNISONE 5 MG PO TABS
15.0000 mg | ORAL_TABLET | Freq: Every day | ORAL | Status: DC
  Administered 2014-10-23: 15 mg via ORAL
  Filled 2014-10-22 (×2): qty 1

## 2014-10-22 MED ORDER — POTASSIUM CHLORIDE CRYS ER 20 MEQ PO TBCR
40.0000 meq | EXTENDED_RELEASE_TABLET | Freq: Once | ORAL | Status: AC
  Administered 2014-10-22: 40 meq via ORAL
  Filled 2014-10-22: qty 2

## 2014-10-22 NOTE — Evaluation (Signed)
Clinical/Bedside Swallow Evaluation Patient Details  Name: Sarah Kirk MRN: [id number] Date of Birth: 12-07-1932  Today's Date: 10/22/2014 Time: [id number] SLP Time Calculation (min) (ACUTE ONLY): 18 min  Past Medical History:  Past Medical History  Diagnosis Date  . HBP (high blood pressure)   . CHF (congestive heart failure)   . HLD (hyperlipidemia)   . Morbid obesity     PFTs 07/14/08 moderate restriction vital capacity 58% with ERV 32% DLCO 38, corrects to 108  . Right heart enlargement     CT 07/16/08. nocturnal desat x 25 min 07/29/08  . Atrial fibrillation   . Diabetes mellitus without complication   . PONV (postoperative nausea and vomiting)   . Depression    Past Surgical History:  Past Surgical History  Procedure Laterality Date  . Broken wrist      x2  . Cataract extraction Bilateral 2006  . Abdominal hysterectomy  1969    abd?  . Breast surgery  1964    cyst-non cancerous   HPI:  81 y/o female from [place] with dyspnea, confusion, tachycardia from PNA, CHF, A fib with RVR. She is followed by Dr. Sherene [facility]ert [facility]. She has hx of recurrent UTI's and had recent admission for Pseudomonal UTI. She is frequently on prednisone for her breathing and antibiotics for UTI's. Her daughter says that she was in hospital recently for Pseudomonal UTI with sepsis, and sent to rehab. She was noted to have decreased appetite and confusion at rehab. Her oxygen level was in the 70's.  Palliative care assisting with symptom management and goals of care. Goal is for short term rehab and ultimately home. Tentatively consider 1/9 for SNF if she has bed placement arranged. SLP consulted for diet advancement. Pt initially placed on puree diet by MD, but is now refusing to eat.    Assessment / Plan / Recommendation Clinical Impression  Pt presents with adequate ability to manage solid textures. Mastication is slow, but complete with no  residuals. Pt does have an intermittent congested cough at baseline that does not appear associated with PO intake. Given that pt has tolerated thin liquids during admission, would not be concerned for high risk of aspiration. Recommend pt resume a regular diet and thin liquids. No SLP f/u needed, will sign off.     Aspiration Risk  Mild    Diet Recommendation Regular;Thin liquid   Liquid Administration via: Cup;Straw Medication Administration: Whole meds with liquid Supervision: Patient able to self feed Postural Changes and/or Swallow Maneuvers: Seated upright 90 degrees    Other  Recommendations Oral Care Recommendations: Oral care BID   Follow Up Recommendations  None    Frequency and Duration        Pertinent Vitals/Pain NA    SLP Swallow Goals     Swallow Study Prior Functional Status       General HPI: 81 y/o [place] from [place] with dyspnea, confusion, tachycardia from PNA, CHF, A fib with RVR. She is followed by Dr. Sherene [facility]Wert [facility]. She has hx of recurrent UTI's and had recent admission for Pseudomonal UTI. She is frequently on prednisone for her breathing and antibiotics for UTI's. Her daughter says that she was in hospital recently for Pseudomonal UTI with sepsis, and sent to rehab. She was noted to have decreased appetite and confusion at rehab. Her oxygen level was in the 70's.  Palliative care assisting with symptom management and goals  of care. Goal is for short term rehab and ultimately home. Tentatively consider 1/9 for SNF if she has bed placement arranged. SLP consulted for diet advancement. Pt initially placed on puree diet by MD, but is now refusing to eat.  Type of Study: Bedside swallow evaluation Previous Swallow Assessment: none Diet Prior to this Study: Dysphagia 1 (puree);Thin liquids Temperature Spikes Noted: No Respiratory Status: Nasal cannula History of Recent Intubation:  No Behavior/Cognition: Alert;Cooperative Oral Cavity - Dentition: Missing dentition Self-Feeding Abilities: Able to feed self Patient Positioning: Upright in bed Baseline Vocal Quality: Clear Volitional Cough: Congested Volitional Swallow: Able to elicit    Oral/Motor/Sensory Function Overall Oral Motor/Sensory Function: Appears within functional limits for tasks assessed   Ice Chips     Thin Liquid Thin Liquid: Within functional limits Presentation: Straw;Cup;Self Fed    Nectar Thick     Honey Thick     Puree Puree: Not tested   Solid   GO    Solid: Within functional limits Presentation: Self Fed      Harlon Ditty, MA CCC-SLP [phone]  Sarah Kirk 10/22/2014,9:16 AM

## 2014-10-22 NOTE — Progress Notes (Signed)
Utilization review completed.  

## 2014-10-22 NOTE — Progress Notes (Signed)
Patient found with IJ removed and moderate amount of blood surrounding site. Charge nurse found patient and applied pressure. Patient VSS. Pressure dressing applied.   Claiborne Billingsallahan, NP notified and stated that he needs IV access placed again to finish IV antibiotic therapy.   Will continue to monitor closely.   Edgardo RoysMcGrath, Dravyn Severs R

## 2014-10-22 NOTE — Discharge Summary (Signed)
Physician Discharge Summary  Sarah BirchwoodHelen Pfahler OZH:[id number] DOB: [birth year] DOA: 10/10/2014  PCP: [name], MD  Admit date: 10/10/2014 Discharge date: 10/23/2014  Time spent: 35 minutes  Recommendations for Outpatient Follow-up:  1. Palliative services to follow at SNF 2. CBC, BMP 1 week 3. O2 2-3L  Discharge Diagnoses:  Active Problems:   Acute respiratory failure with hypoxia   DNR (do not resuscitate)   Palliative care encounter   Dyspnea and respiratory abnormality   Acute respiratory failure   Congestive heart disease   Encephalopathy   Discharge Condition: improved  Diet recommendation: carb mod/cardiac  Filed Weights   10/10/14 1300  Weight: 85.276 kg (188 lb)    History of present illness:  Pt unable to provide hx. Hx from medical records and discussion with her daughter.  She has chronic dyspnea and is on 3 liters oxygen. She is frequently on prednisone for her breathing and antibiotics for UTI's. Her daughter says that she was in hospital recently for Pseudomonal UTI with sepsis, and sent to rehab. She was noted to have decreased appetite and confusion at rehab. Her oxygen level was in the 70's. [facility] Her chest xray there showed perihilar infiltrates. She was more confused, short of breath, and hypoxic. She also had a fib with RVR. She was started on oxygen and Abx. She required BiPAP [facility].  Her daughter is next of kin, and pt has one sister. They were in the process of making a living will. Patient has told them before that she would never want to be on life support for any reason.  Hospital Course:  Acute on chronic respiratory failure from HCAP, CHF with hx of pulmonary fibrosis. Oxygen to keep SpO2 88 to 94% F/u CXR intermittently Scheduled BD's with atrovent/xopenex Q6 Transition to oral pred with taper (not prednisone dependent at baseline)   A fib with RVR - rate controlled  Acute on  chronic diastolic dysfx. Resume home dose lasix  Continue lopressor 25 mg BID PO    Dysphagia. regular diet Protonix to PO   Sepsis. HCAP. Recurrent UTIs.  Day 14/14 cefepime    DM type II.  SSI  hypokalemia Replete  Acute encephalopathy 2nd to sepsis, respiratory failure >>improving. Still sleepy  Hold outpt wellbutrin, prozac, consider restart as outpatient  Overall poor prognosis   Procedures:    Consultations: PCCM Palliative care   Discharge Exam: Filed Vitals:   10/23/14 0632  BP: 150/101  Pulse: 98  Temp: 98.3 F (36.8 C)  Resp: 20    General: in bed, no overnight events   Discharge Instructions   Discharge Instructions    Diet - low sodium heart healthy    Complete by:  As directed      Diet Carb Modified    Complete by:  As directed      Discharge instructions    Complete by:  As directed   Continue 2 L 02 Palliative to follow at SNF     Increase activity slowly    Complete by:  As directed           Current Discharge Medication List    START taking these medications   Details  feeding supplement, ENSURE COMPLETE, (ENSURE COMPLETE) LIQD Take 237 mLs by mouth 2 (two) times daily between meals.    insulin aspart (NOVOLOG) 100 UNIT/ML injection Inject 0-20 Units into the skin 3 (three) times daily with meals. Qty: 10 mL, Refills: 11  metoprolol tartrate (LOPRESSOR) 25 MG tablet Take 1 tablet (25 mg total) by mouth 2 (two) times daily.      CONTINUE these medications which have CHANGED   Details  predniSONE (DELTASONE) 5 MG tablet 15 mg x 2 days, 10 mg x 2 days, 5 mg x 2 days then d/c      CONTINUE these medications which have NOT CHANGED   Details  ipratropium (ATROVENT) 0.02 % nebulizer solution Take 0.5 mg by nebulization every 6 (six) hours as needed for wheezing or shortness of breath.    albuterol (PROVENTIL) (2.5 MG/3ML) 0.083% nebulizer solution Take 3 mLs (2.5 mg total) by nebulization every 4 (four)  hours as needed for wheezing or shortness of breath. Qty: 75 mL, Refills: 12    cloNIDine (CATAPRES) 0.1 MG tablet Take 1 tablet (0.1 mg total) by mouth 2 (two) times daily. Qty: 60 tablet, Refills: 11    furosemide (LASIX) 40 MG tablet Take 40 mg by mouth daily.     omeprazole (PRILOSEC) 20 MG capsule Take 1 capsule (20 mg total) by mouth daily before breakfast. Qty: 30 capsule, Refills: 11    potassium chloride SA (K-DUR,KLOR-CON) 20 MEQ tablet Take 2 tablets (40 mEq total) by mouth daily. Qty: 60 tablet, Refills: 11      STOP taking these medications     GARCINIA CAMBOGIA-CHROMIUM PO      guaifenesin (HUMIBID E) 400 MG TABS tablet      Sulfamethoxazole-Trimethoprim (BACTRIM PO)      ALPRAZolam (XANAX) 0.25 MG tablet      aspirin 325 MG tablet      buPROPion (WELLBUTRIN SR) 100 MG 12 hr tablet      cefdinir (OMNICEF) 300 MG capsule      dextromethorphan-guaiFENesin (MUCINEX DM) 30-600 MG per 12 hr tablet      ESTRACE VAGINAL 0.1 MG/GM vaginal cream      famotidine (PEPCID) 20 MG tablet      FLUoxetine (PROZAC) 40 MG capsule      insulin NPH-regular Human (NOVOLIN 70/30) (70-30) 100 UNIT/ML injection      nebivolol (BYSTOLIC) 10 MG tablet      oxybutynin (DITROPAN) 5 MG tablet        Allergies  Allergen Reactions  . Tetracyclines & Related Other (See Comments)    Breaks her mouth out  . Atenolol     Worsening lung function  . Macrobid [Nitrofurantoin]     Worsening lung fx  . Metformin     Doctor doesn't want pt to take because it will scar her lungs      The results of significant diagnostics from this hospitalization (including imaging, microbiology, ancillary and laboratory) are listed below for reference.    Significant Diagnostic Studies: Dg Chest 2 View  10/22/2014   CLINICAL DATA:  Pneumonia  EXAM: CHEST  2 VIEW  COMPARISON:  10/18/2014  FINDINGS: Cardiac shadow remains enlarged. A right jugular line is again noted deep within the right atrium.  This could be withdrawn 3-4 cm. Patchy opacities are again identified throughout both lungs particularly in the left mid lung and right upper lobe. The overall appearance is stable when compared with the prior exam. No new focal infiltrate is seen. No bony abnormality is noted.  IMPRESSION: Jugular line deep in the right atrium. This should be withdrawn as described.  Stable patchy opacities bilaterally.  These results will be called to the ordering clinician or representative by the Radiologist Assistant, and communication documented in the PACS  or zVision Dashboard.   Electronically Signed   By: Alcide Clever M.D.   On: 10/22/2014 07:54   Dg Chest Port 1 View  10/18/2014   CLINICAL DATA:  Respiratory failure  EXAM: PORTABLE CHEST - 1 VIEW  COMPARISON:  10/13/2014  FINDINGS: Right central line tip remains in the lower right atrium, stable. Cardiomegaly, stable. Slight improvement in aeration and lung volumes since prior study with decreasing bibasilar opacities. Patchy bilateral opacities persist.  IMPRESSION: Improving lung volumes and decreasing bibasilar opacities.   Electronically Signed   By: Charlett Nose M.D.   On: 10/18/2014 08:02   Dg Chest Port 1 View  10/13/2014   CLINICAL DATA:  Acute respiratory failure  EXAM: PORTABLE CHEST - 1 VIEW  COMPARISON:  10/11/2014; 10/09/2014; 10/03/2014; chest CT - 10/03/2014  FINDINGS: Grossly unchanged enlarged cardiac silhouette and mediastinal contours given persistently reduced lung volumes and patient rotation. Stable position of support apparatus including right jugular approach central venous catheter overlying the right atrium. No pneumothorax. Minimally improved aeration of the left upper and right mid lung with otherwise extensive heterogeneous interstitial and airspace opacities. Unchanged small bilateral effusions, left greater than right. Unchanged bones.  IMPRESSION: 1. Minimally improved aeration along suggests resolving edema and/or atelectasis. 2.  Persistent interstitial opacities worse within the right upper lung and bilateral lung bases, left greater than right with differential considerations including residual asymmetric pulmonary edema and/or multifocal infection. Continued attention on follow-up is recommended. 3. Right jugular approach intravenous catheter tip again overlies the right atrium. Retraction approximately 6 cm would place the tip regional to the superior cavoatrial junction. No pneumothorax.   Electronically Signed   By: Simonne Come M.D.   On: 10/13/2014 08:04   Dg Chest Port 1 View  10/11/2014   CLINICAL DATA:  Congestive heart failure  EXAM: PORTABLE CHEST - 1 VIEW  COMPARISON:  Portable chest x-ray of October 09, 2014  FINDINGS: The interstitial markings have become more confluent. The cardiac silhouette is obscured. The pulmonary vascularity is indistinct. The right internal jugular venous catheter tip projects over the junction of the SVC with the right atrium. Small amounts of pleural fluid blunt the costophrenic angles.  IMPRESSION: Worsening interstitial and alveolar edema consistent with CHF. Underlying pneumonia is not excluded.   Electronically Signed   By: David  [place]   On: 10/11/2014 07:48    Microbiology: Recent Results (from the past 240 hour(s))  Clostridium Difficile by PCR     Status: None   Collection Time: 10/21/14  5:43 AM  Result Value Ref Range Status   C difficile by pcr NEGATIVE NEGATIVE Final     Labs: Basic Metabolic Panel:  Recent Labs Lab 10/18/14 0300 10/19/14 0555 10/20/14 0634 10/22/14 0510 10/23/14 0315  NA 143 139 139 137 136  K 3.2* 3.9 4.0 3.3* 3.8  CL 93* 95* 95* 92* 92*  CO2 44* 39* 38* 35* 36*  GLUCOSE 71 152* 151* 172* 128*  BUN 24* CREATININE 0.75 0.79 0.63 0.61 0.61  CALCIUM 8.5 8.7 8.7 8.4 8.4   Liver Function Tests: No results for input(s): AST, ALT, ALKPHOS, BILITOT, PROT, ALBUMIN in the last 168 hours. No results for input(s): LIPASE, AMYLASE  in the last 168 hours. No results for input(s): AMMONIA in the last 168 hours. CBC:  Recent Labs Lab 10/18/14 0300 10/19/14 0855 10/20/14 0634 10/22/14 0510 10/23/14 0315  WBC 10.8* 14.9* 11.6* 14.4* 16.9*  HGB 11.3* 11.5* 11.4* 11.3* 11.5*  HCT 35.7* 36.1 35.4* 34.5* 34.8*  MCV 88.4 88.9 88.5 87.3 86.4  PLT 236 293 281 251 248   Cardiac Enzymes: No results for input(s): CKTOTAL, CKMB, CKMBINDEX, TROPONINI in the last 168 hours. BNP: BNP (last 3 results)  Recent Labs  09/02/14 1554  PROBNP 591.0*   CBG:  Recent Labs Lab 10/22/14 1123 10/22/14 1611 10/22/14 2113 10/23/14 0628 10/23/14 0827  GLUCAP 155* 349* 162* 147* 141*       Signed:  Taqwa Deem  [facility] 10/23/2014, 8:37 AM

## 2014-10-22 NOTE — Progress Notes (Signed)
PROGRESS NOTE  Gean BirchwoodHelen Kobus [id number] DOB: 10-03-1933 DOA: 10/10/2014 PCP: [name], MD  81 y/o female from [place] with dyspnea, confusion, tachycardia from PNA, CHF, A fib with RVR. She is followed by Dr. She[facility]ert [facility]. She has hx of recurrent UTI's and had recent admission for Pseudomonal UTI. She is frequently on prednisone for her breathing and antibiotics for UTI's. Her daughter says that she was in hospital recently for Pseudomonal UTI with sepsis, and sent to rehab. She was noted to have decreased appetite and confusion at rehab. Her oxygen level was in the 70's. [facility] Family would still like to continue medical therapy otherwise. Palliative care assisting with symptom management and goals of care. Goal is for short term rehab and ultimately home. Tentatively consider 1/9 for SNF if she has bed placement arranged.    Assessment/Plan: Acute on chronic respiratory failure from HCAP, CHF with hx of pulmonary fibrosis. Oxygen to keep SpO2 88 to 94% F/u CXR intermittently Scheduled BD's with atrovent/xopenex Q6 Transition to oral pred  with taper (not prednisone dependent at baseline)     A fib with RVR - rate controlled  Acute on chronic diastolic dysfx. Resume home dose lasix  Continue lopressor 25 mg BID PO PRN lopressor to maintain HR < 120 Telemetry monitoring  Will need to consider d/c central line if tolerates PO's meds. Leave in for now till completes abx or is dc'd.   Dysphagia. D1 diet- ask for re-eval as patient does not like DYS 1 diet Protonix to PO   Sepsis. HCAP. Recurrent UTIs.  Day 13/14 cefepime - last dose tomm PM   DM type II.  SSI  hypokalemia Replete  Acute encephalopathy 2nd to sepsis, respiratory failure >>improving. Still sleepy  Morphine decreased 12/30 - 0.5-1mg  q3 PRN Hold outpt wellbutrin, prozac, consider restart only once more  awake.  Poor overall prognosis   Code Status: DNR Family Communication: no family at bedside Disposition Plan: SNF with palliative services to follow- in AM??   Consultants:  PCCM  Palliative care  Procedures:      HPI/Subjective: Does not like the DYS diet   Objective: Filed Vitals:   10/22/14 0643  BP: 167/96  Pulse: 97  Temp:   Resp: 20    Intake/Output Summary (Last 24 hours) at 10/22/14 0739 Last data filed at 10/21/14 2037  Gross per 24 hour  Intake    300 ml  Output      0 ml  Net    300 ml   Filed Weights   10/10/14 1300  Weight: 85.276 kg (188 lb)    Exam:   General:  cooperative  Cardiovascular: irr  Respiratory: weak cough, no increased work of breathing  Abdomen: +BS, soft  Musculoskeletal: moves all 4 ext   Data Reviewed: Basic Metabolic Panel:  Recent Labs Lab 10/16/14 0429 10/18/14 0300 10/19/14 0555 10/20/14 0634 10/22/14 0510  NA 137 143 139 139 137  K 3.6 3.2* 3.9 4.0 3.3*  CL 85* 93* 95* 95* 92*  CO2 41* 44* 39* 38* 35*  GLUCOSE 115* 71 152* 151* 172*  BUN 18 24* 23 23 18   CREATININE 0.72 0.75 0.79 0.63 0.61  CALCIUM 8.9 8.5 8.7 8.7 8.4   Liver Function Tests: No results for input(s): AST, ALT, ALKPHOS, BILITOT, PROT, ALBUMIN in the last 168 hours. No results for input(s): LIPASE, AMYLASE in the last 168 hours. No results for input(s): AMMONIA  in the last 168 hours. CBC:  Recent Labs Lab 10/18/14 0300 10/19/14 0855 10/20/14 0634 10/22/14 0510  WBC 10.8* 14.9* 11.6* 14.4*  HGB 11.3* 11.5* 11.4* 11.3*  HCT 35.7* 36.1 35.4* 34.5*  MCV 88.4 88.9 88.5 87.3  PLT 236 293 281 251   Cardiac Enzymes: No results for input(s): CKTOTAL, CKMB, CKMBINDEX, TROPONINI in the last 168 hours. BNP (last 3 results)  Recent Labs  09/02/14 1554  PROBNP 591.0*   CBG:  Recent Labs Lab 10/21/14 0610 10/21/14 1121 10/21/14 1640 10/21/14 2042 10/22/14 0639  GLUCAP 114* 178* 228* 239* 182*    Recent Results  (from the past 240 hour(s))  Clostridium Difficile by PCR     Status: None   Collection Time: 10/21/14  5:43 AM  Result Value Ref Range Status   C difficile by pcr NEGATIVE NEGATIVE Final     Studies: No results found.  Scheduled Meds: . antiseptic oral rinse  7 mL Mouth Rinse BID  . ceFEPime (MAXIPIME) IV  2 g Intravenous Q12H  . enoxaparin (LOVENOX) injection  40 mg Subcutaneous Q24H  . feeding supplement (ENSURE COMPLETE)  237 mL Oral BID BM  . furosemide  40 mg Oral Daily  . insulin aspart  0-20 Units Subcutaneous TID WC  . ipratropium  0.5 mg Nebulization Q6H WA  . levalbuterol  0.63 mg Nebulization Q6H WA  . metoprolol tartrate  25 mg Oral BID  . pantoprazole  40 mg Oral Q1200  . predniSONE  20 mg Oral Q breakfast   Continuous Infusions:  Antibiotics Given (last 72 hours)    Date/Time Action Medication Dose Rate   10/19/14 1120 Given   ceFEPIme (MAXIPIME) 2 g in dextrose 5 % 50 mL IVPB 2 g 100 mL/hr   10/19/14 2207 Given   ceFEPIme (MAXIPIME) 2 g in dextrose 5 % 50 mL IVPB 2 g 100 mL/hr   10/20/14 1126 Given   ceFEPIme (MAXIPIME) 2 g in dextrose 5 % 50 mL IVPB 2 g 100 mL/hr   10/20/14 2155 Given   ceFEPIme (MAXIPIME) 2 g in dextrose 5 % 50 mL IVPB 2 g 100 mL/hr   10/21/14 1000 Given   ceFEPIme (MAXIPIME) 2 g in dextrose 5 % 50 mL IVPB 2 g 100 mL/hr   10/21/14 2116 Given   ceFEPIme (MAXIPIME) 2 g in dextrose 5 % 50 mL IVPB 2 g 100 mL/hr      Active Problems:   Acute respiratory failure with hypoxia   DNR (do not resuscitate)   Palliative care encounter   Dyspnea and respiratory abnormality   Acute respiratory failure   Congestive heart disease   Encephalopathy    Time spent: 25    [facility], JESSICA  [facility] Pager [phone]. If 7PM-7AM, please contact night-coverage at [email or web address], password [facility] 10/22/2014, 7:39 AM  LOS: 12 days

## 2014-10-22 NOTE — Clinical Social Work Placement (Addendum)
Clinical Social Work Department CLINICAL SOCIAL WORK PLACEMENT NOTE 10/22/2014  Patient:  Sarah Kirk, Sarah Kirk  Account Number:  [id number] Admit date:  10/10/2014  Clinical Social Worker:  ERIC ANTERHAUS, LCSWA  Date/time:  10/21/2014 10:05 AM  Clinical Social Work is seeking post-discharge placement for this patient at the following level of care:   [place]   (*CSW will update this form in Epic as items are completed)   10/21/2014  Patient/family provided with [place] Department of Clinical Social Work's list of facilities offering this level of care within the geographic area requested by the patient (or if unable, by the patient's family).  10/21/2014  Patient/family informed of their freedom to choose among providers that offer the needed level of care, that participate in Medicare, Medicaid or managed care program needed by the patient, have an available bed and are willing to accept the patient.  10/21/2014  Patient/family informed of [facility]' ownership interest [facility], as well as of the fact that they are under no obligation to receive care at this facility.  PASARR submitted to EDS on 10/21/2014 PASARR number received on 10/21/2014  FL2 transmitted to all facilities in geographic area requested by pt/family on  10/21/2014 FL2 transmitted to all facilities within larger geographic area on 10/21/2014  Patient informed that his/her managed care company has contracts with or will negotiate with  certain facilities, including the following:     Patient/family informed of bed offers received:  10/21/2014 Patient chooses bed at [facility] Physician recommends and patient chooses bed at    Patient to be transferred to [facility] on  10/23/14 Patient to be transferred to facility by Ambulance  Patient and family notified of transfer on  10/23/14  Name of family member notified: Norva Riffle   The following physician request  were entered in Epic:   Additional Comments:  Physician please sign FL2  Jones Broom. [place], MSW, [place] 10/22/2014 10:07 AM   10/23/13 OK per MD for d/c today to SNF. Daughter notified of d/c and was pleased with this report.  CSW met with patient- she was noted to be sleeping very heavily but was arousable. Had not eaten her breakfast; nursing aware.  Pt nodded understanding of d/c plan. Nursing to call report. Per Dr. Eliseo Squires- patient will require an IV antibiotic dose at 3pm and thus need to delay d/c until 4:30 pick up. [place] notified re: same and nurse aware.  No further CSW needs identified. CSW signing off.  Lorie Phenix. Pauline Good, [place]

## 2014-10-23 LAB — GLUCOSE, CAPILLARY
GLUCOSE-CAPILLARY: 235 mg/dL — AB (ref 70–99)
Glucose-Capillary: 147 mg/dL — ABNORMAL HIGH (ref 70–99)
Glucose-Capillary: 141 mg/dL — ABNORMAL HIGH (ref 70–99)
Glucose-Capillary: 163 mg/dL — ABNORMAL HIGH (ref 70–99)

## 2014-10-23 LAB — CBC
HCT: 34.8 % — ABNORMAL LOW (ref 36.0–46.0)
HEMOGLOBIN: 11.5 g/dL — AB (ref 12.0–15.0)
MCH: 28.5 pg (ref 26.0–34.0)
MCHC: 33 g/dL (ref 30.0–36.0)
MCV: 86.4 fL (ref 78.0–100.0)
PLATELETS: 248 10*3/uL (ref 150–400)
RBC: 4.03 MIL/uL (ref 3.87–5.11)
RDW: 16.7 % — ABNORMAL HIGH (ref 11.5–15.5)
WBC: 16.9 10*3/uL — ABNORMAL HIGH (ref 4.0–10.5)

## 2014-10-23 LAB — BASIC METABOLIC PANEL
ANION GAP: 8 (ref 5–15)
BUN: 16 mg/dL (ref 6–23)
CALCIUM: 8.4 mg/dL (ref 8.4–10.5)
CO2: 36 mmol/L — AB (ref 19–32)
Chloride: 92 mEq/L — ABNORMAL LOW (ref 96–112)
Creatinine, Ser: 0.61 mg/dL (ref 0.50–1.10)
GFR, EST NON AFRICAN AMERICAN: 84 mL/min — AB (ref >90)
Glucose, Bld: 128 mg/dL — ABNORMAL HIGH (ref 70–99)
Potassium: 3.8 mmol/L (ref 3.5–5.1)
Sodium: 136 mmol/L (ref 135–145)

## 2014-10-23 MED ORDER — ENSURE COMPLETE PO LIQD: 237.0000 mL | Freq: Two times a day (BID) | ORAL | Status: AC

## 2014-10-23 MED ORDER — PREDNISONE 5 MG PO TABS: ORAL_TABLET | ORAL | Status: AC

## 2014-10-23 MED ORDER — INSULIN ASPART 100 UNIT/ML ~~LOC~~ SOLN: 0.0000 [IU] | Freq: Three times a day (TID) | SUBCUTANEOUS | Status: AC

## 2014-10-23 MED ORDER — METOPROLOL TARTRATE 25 MG PO TABS: 25.0000 mg | ORAL_TABLET | Freq: Two times a day (BID) | ORAL | Status: AC

## 2014-10-23 NOTE — Progress Notes (Addendum)
Report given to Summer at [facility]. Darrel HooverWilson,Brayla Pat S 4:35 PM   Pt discharged to SNF  Discharge instructions given to transport Education discussed  IV dc'd  Tele dc'd  Pt discharged via stretcher.  Daughter with all pt belongs.   Darrel HooverWilson,Lasondra Hodgkins S 4:49 PM

## 2014-12-13 DIAGNOSIS — J8410 Pulmonary fibrosis, unspecified: Secondary | ICD-10-CM | POA: Diagnosis not present

## 2014-12-13 DIAGNOSIS — I509 Heart failure, unspecified: Secondary | ICD-10-CM | POA: Diagnosis not present

## 2014-12-14 DEATH — deceased

## 2014-12-15 DIAGNOSIS — J8410 Pulmonary fibrosis, unspecified: Secondary | ICD-10-CM | POA: Diagnosis not present

## 2015-03-04 ENCOUNTER — Ambulatory Visit: Payer: Medicare Other | Admitting: Internal Medicine

## 2015-03-04 DIAGNOSIS — Z0289 Encounter for other administrative examinations: Secondary | ICD-10-CM
